# Patient Record
Sex: Male | Born: 1960
Health system: Southern US, Community
[De-identification: ages and names within clinical notes are randomized; demographics above are authoritative.]

## PROBLEM LIST (undated history)

## (undated) DIAGNOSIS — K635 Polyp of colon: Principal | ICD-10-CM

## (undated) DIAGNOSIS — Z86718 Personal history of other venous thrombosis and embolism: Secondary | ICD-10-CM

## (undated) DIAGNOSIS — Z9109 Other allergy status, other than to drugs and biological substances: Secondary | ICD-10-CM

## (undated) HISTORY — DX: Polyp of colon: K63.5

## (undated) HISTORY — PX: SHOULDER ARTHROSCOPY: SHX128

## (undated) HISTORY — DX: Personal history of other venous thrombosis and embolism: Z86.718

## (undated) HISTORY — PX: SHOULDER ARTHROSCOPY WITH SUBACROMIAL DECOMPRESSION, ROTATOR CUFF REPAIR AND BICEP TENDON REPAIR: SHX5687

## (undated) HISTORY — DX: Other allergy status, other than to drugs and biological substances: Z91.09

---

## 1998-12-01 HISTORY — PX: GANGLION CYST EXCISION: SHX1691

## 2001-03-05 ENCOUNTER — Encounter (INDEPENDENT_AMBULATORY_CARE_PROVIDER_SITE_OTHER): Payer: Self-pay

## 2001-03-05 ENCOUNTER — Ambulatory Visit (HOSPITAL_COMMUNITY): Admission: RE | Admit: 2001-03-05 | Discharge: 2001-03-05 | Payer: Self-pay | Admitting: Orthopedic Surgery

## 2002-01-21 ENCOUNTER — Encounter (INDEPENDENT_AMBULATORY_CARE_PROVIDER_SITE_OTHER): Payer: Self-pay | Admitting: Specialist

## 2002-01-21 ENCOUNTER — Ambulatory Visit (HOSPITAL_COMMUNITY): Admission: RE | Admit: 2002-01-21 | Discharge: 2002-01-21 | Payer: Self-pay | Admitting: Orthopedic Surgery

## 2002-12-04 ENCOUNTER — Emergency Department (HOSPITAL_COMMUNITY): Admission: EM | Admit: 2002-12-04 | Discharge: 2002-12-05 | Payer: Self-pay | Admitting: Emergency Medicine

## 2002-12-04 ENCOUNTER — Encounter: Payer: Self-pay | Admitting: Emergency Medicine

## 2003-03-20 ENCOUNTER — Emergency Department (HOSPITAL_COMMUNITY): Admission: EM | Admit: 2003-03-20 | Discharge: 2003-03-21 | Payer: Self-pay

## 2007-11-11 ENCOUNTER — Encounter: Admission: RE | Admit: 2007-11-11 | Discharge: 2007-11-11 | Payer: Self-pay | Admitting: Internal Medicine

## 2011-04-18 NOTE — Op Note (Signed)
[facility]  Patient:    Justin Wong, Justin Wong                    MRN: [id number] Proc. Date: 03/05/01 Adm. Date:  [id number] Attending:  Marlowe Kays Page                           Operative Report  PREOPERATIVE DIAGNOSES: 1. Ganglion cyst flexure left wrist. 2. Suspected lipoma posterior right shoulder. 3. Painful subcutaneous nodules left lower back and abdomen (total #5).  POSTOPERATIVE DIAGNOSES: 1. Ganglion left wrist arising from carpal metacarpal joint. 2. Large lipoma posterior right shoulder. 3. Subcutaneous lipomas left lower back and abdomen and left chest.  OPERATION: 1. Incision of ganglion cyst flexure of left wrist. 2. Incision of the lipoma posterior right shoulder. 3. Excision of multiple subcutaneous lipomas left lower back, abdomen, and    left chest wall.  SURGEON:  Illene Labrador. Aplington, M.D.  ASSISTANT:  Nurse.  ANESTHESIA:  General.  PATHOLOGY AND JUSTIFICATION FOR PROCEDURE:  The lipomas were all painful for Justin Wong.  The cyst was also painful.  See operative description below.  DESCRIPTION OF PROCEDURE:  Satisfactory general anesthesia.  We first started out in the prone position and, preoperatively, I had marked out the areas of the painful subcutaneous masses.  Starting first on the back, I prepped the back and the shoulder with Betadine and draped in a sterile field.  Small Iobans employed.  In both cases, I infiltrated the proposed line of incision with 0.5% Marcaine with adrenalin.  In the left lower back, going through the subcutaneous tissue, I found a 1 cm very firm lipomatous mass which was different from his other fatty tissue and corresponded to the area of pain and tenderness, and this was excised.  The subcutaneous tissue was closed with 3-0 Vicryl and skin with interrupted 4-0 nylon mattress sutures.  On his right shoulder, he had a 5 x 7 cm large lipoma which also appeared benign.  One feeder vessel into it  was coagulated.  The wound was also infiltrated with 0.5% Marcaine with adrenalin.  The subcutaneous tissue with a cavity present with reapproximated with interrupted 2-0 Vicryl and the skin with interrupted 3-0 nylon mattress sutures.  In this wound, Betadine, Adaptic dry sterile dressing and on the low back wound, two Band-Aids were applied.  We then turned Justin Wong over onto another OR table and placed Justin Wong in the supine position.  I first prepped out the abdomen, as we had the back, and draped the areas in a sterile field.  The left thoracic nodule was handled like the structures posteriorly, and I found a 1.5 cm firm, lipomatous-type mass.  The subcutaneous tissue was closed with interrupted 3-0 Vicryl and skin with interrupted 4-0 nylon mattress sutures.  The left lower abdominal mass was larger at 3 cm x 1.5 cm and was handled as was the wound just above it.  The two wounds in the right lower abdomen had one lipoma which measured 5 cm x 2 cm and the other one, which was much smaller.  These were handles as were the other wounds in terms of closure.  I then placed Betadine, Adaptic dry sterile dressing on these wound except for the left upper one which we closed skin with Band-aids.  I then prepared the left upper extremity for the surgery.  Pneumatic tourniquet was applied and the left forearm just distal to the fingers  was prepped with Duraprep and draped in a sterile field.  I made a vertical incision along the mass which was about 1 cm in size, through the subcutaneous tissue, sensory nerve was identified and protected.  He had multiple vascular structures which I also tried to protect.  The cyst was dissected out and was carried down to what appeared to be carpal metacarpal joint.  It was excised in its base and the defect closed with multiple interrupted 4-0 Vicryl.  Several bleeders were coagulated with bipolar cautery.  I then released the tourniquet, and there was no unusual  bleeding at this point.  This allowed me to close the skin and subcutaneous tissue only with interrupted 4-0 nylon mattress sutures.  This wound was also infiltrated with 0.5% Marcaine with adrenalin.  Betadine, Adaptic dry sterile dressing, and a thumb spike and splint cast was applied.  He tolerated the procedure well and at the time of this dictation, was on his way to the recovery room in satisfactory condition with no known complications. DD:  03/05/01 TD:  03/05/01 Job: [id number]

## 2011-04-18 NOTE — Op Note (Signed)
[facility]  Patient:    Justin Wong, Justin Wong Visit Number: [id number] MRN: [id number]          Service Type: Attending:  Illene Labrador. Aplington, M.D. Dictated by:   Illene Labrador. Aplington, M.D. Proc. Date: 01/21/02                             Operative Report  PREOPERATIVE DIAGNOSIS:  Recurrent ganglion cyst in the flexor radial surface, left wrist.  POSTOPERATIVE DIAGNOSIS:  Mass flexor radial surface, left wrist.  OPERATION:  Excision of mass, flexor radial surface, left wrist.  SURGEON:  Illene Labrador. Aplington, M.D.  ASSISTANT:  Nurse.  ANESTHESIA:  General.  PATHOLOGY AND JUSTIFICATION FOR PROCEDURE:  Among may operative procedures, I had excised a ganglion cyst on this flexor left wrist on March 05, 2001.  He subsequently had a recurrent mass there which has fluctuated in size and is here at this time for excision.  See operative description below for additional details.  DESCRIPTION OF PROCEDURE:  Satisfactory general anesthesia, pneumatic tourniquet, DuraPrep from mid forearm to fingertips, draped in a sterile field.  I went through the old surgical incision basically over the flexor radial wrist and curving slightly towards the midline at the base of the thenar eminence.  A swollen, nodular mass was identified.  I was able to feel it preoperatively.  Intimately entwined with it was a very vascular plexus which I dissected off as carefully as possible.  What appeared to be the radial artery was intimately involved with this mass.  I dissected off the artery and followed the mass which turned out to be very firm, almost neuromatous in nature but also did have a stalk which went down to the carpometacarpal joint.  I followed down to the carpometacarpal joint where I sectioned it at this point.  The mass was sent to pathology.  I then closed the aperture, going down the carpometacarpal joint tightly with multiple interrupted 3-0 Vicryl sutures.  I then  released the tourniquet, and there was a significant amount of bleeding.  I had used a bipolar cautery for what appeared to be potential bleeders during the case.  Some of the smaller bleeding was controlled, but I had to reinflate the tourniquet and found that the major bleeding was from a deformed section of radial artery where it appeared that multiple tributaries were coming off, and this was just not repairable.  Consequently, I clamped the artery on either side of this major interchange, removing it and then tying the artery on both ends.  I then re-released the tourniquet.  There was still some small bleeding more towards the radial side, and I was able to control this with regular and bipolar cautery.  The wound was then dry at the time of closure.  I placed Gelfoam over the repair site and over the areas of the previous residual bleeding which I had cauterized.  The skin and subcutaneous tissue, I closed as a unit with interrupted 4-0 nylon mattress sutures.  Betadine, Adaptic dry sterile dressing and the thumb spike as splint cast were applied.  He tolerated the procedure well and at the time of this dictation was on his way to the recovery room in satisfactory condition with no known complications. Dictated by:   Illene Labrador. Aplington, M.D. Attending:  Illene Labrador. Aplington, M.D. DD:  01/21/02 TD:  01/21/02 Job: [id number]

## 2011-04-18 NOTE — Op Note (Signed)
NAME:  Justin Wong, Justin Wong                       ACCOUNT NO.:  [id number]   MEDICAL RECORD NO.:  [id number]                   PATIENT TYPE:  EMS   LOCATION:  ED                                   FACILITY:  [facility]   PHYSICIAN:  Lyndal Pulley. Chales Salmon, M.D.                DATE OF BIRTH:  June 20, 1961   DATE OF PROCEDURE:  03/21/2003  DATE OF DISCHARGE:  03/21/2003                                 OPERATIVE REPORT   PREOPERATIVE DIAGNOSES:  1. Multiple left eyelid lacerations.  2. Left stellate full thickness complicated brow laceration.  3. Vertical left upper eyelid through and through laceration.  4. Left horizontal lower eyelid full thickness laceration.   POSTOPERATIVE DIAGNOSES:  1. Multiple left eyelid lacerations.  2. Left stellate full thickness complicated brow laceration.  3. Vertical left upper eyelid through and through laceration.  4. Left horizontal lower eyelid full thickness laceration.   OPERATION PERFORMED:  Closure of above laceration.   SURGEON:  Lyndal Pulley. Chales Salmon, M.D.   ANESTHESIA:  Local.   INDICATIONS FOR PROCEDURE:  The patient is a 50 year old male who presented  to the emergency department at [facility], while cutting wood was struck in  the left eye area. He was evaluated by the emergency room doctor who did  feel he had a left corneal abrasion and I was consulted for closure of the  lacerations. He had no other injuries reported.   DESCRIPTION OF PROCEDURE:  The patient was identified in the emergency  department and was given approximately 6 mL of 2% lidocaine with 1:100,000  epinephrine around the lacerations. The wounds were then irrigated with  copious amounts of sterile saline and gently debrided. Additionally,  Betadine solution was also used to irrigate the wound and was irrigated from  the wounds as well. Attention was first directed to the left eyelid  laceration which measured approximately 4 cm in length. The laceration  extended tangentially through  the skin, underlying auricularis muscle  down  to the underlying orbital septum. The wound was carefully closed in layers  first using 5-0 plain gut suture reapproximating the orbicularis muscle.  Next, the skin was closed with 6-0 plain gut suture in a running baseball  fashion. Attention was then directed to the left eyebrow laceration which  was found to be a stellate pattern which extended down through the  underlying skin, subcutaneous tissue, muscle, and down to the underlying  periosteum over the left supraorbital rim. The wound was also carefully  closed in layers, first reapproximating the deep tissue with 4-0 plain gut  suture using several interrupted sutures. Next, several 5-0 plain gut  sutures were placed as subcutaneous stitches. The skin was then closed with  5-0 nylon suture both in interrupted and baseball fashion. Next, attention  was directed to the left upper eyelid vertical incision which was found to  be a through and through laceration extending through the  tarsal plate and  lash line. This extended through the conjunctive on the inside of the upper  eyelid as well. The laceration measured approximately 3 cm. Initially the  edge of the skin over the tarsal plate was reapproximated and closed with a  6-0 nylon suture. Next, several deep interrupted sutures were placed using 5-  0 plain gut suture. The skin was closed again with 6-0 nylon suture. All of  the wounds were coated with Neosporin ointment and postoperative  instructions were given to him. He also was given Keflex 500 mg x 30 to be  taken 4 times a day until gone. He was also given Maxidone 1 p.o. q. 4-6h  p.r.n. pain. He also was instructed to followup with an ophthalmologist in  the morning for the corneal abrasion and further ophthalmologic examination.  He also should be followed in my office in one week for suture removal and  followup.                                                Lyndal Pulley Chales Salmon,  M.D.    TGO/MEDQ  D:  03/21/2003  T:  03/21/2003  Job:  [id number]

## 2011-06-24 ENCOUNTER — Ambulatory Visit: Payer: Self-pay | Admitting: Family Medicine

## 2011-10-30 ENCOUNTER — Telehealth: Payer: Self-pay | Admitting: Internal Medicine

## 2011-10-30 NOTE — Telephone Encounter (Signed)
This pt's friend Harriett Sine [phone]) has called and asked if Dr.[name] will take him on as a new pt.  The pt has [employer].  Please advise   Thanks so much!!

## 2011-11-03 NOTE — Telephone Encounter (Signed)
OK - pls inform of long waits we have Thx

## 2012-02-17 ENCOUNTER — Other Ambulatory Visit (INDEPENDENT_AMBULATORY_CARE_PROVIDER_SITE_OTHER): Payer: BC Managed Care – PPO

## 2012-02-17 ENCOUNTER — Ambulatory Visit (INDEPENDENT_AMBULATORY_CARE_PROVIDER_SITE_OTHER): Payer: BC Managed Care – PPO | Admitting: Internal Medicine

## 2012-02-17 ENCOUNTER — Encounter: Payer: Self-pay | Admitting: Internal Medicine

## 2012-02-17 VITALS — BP 140/100 | HR 80 | Temp 98.6°F | Resp 16 | Ht 60.5 in | Wt 184.0 lb

## 2012-02-17 DIAGNOSIS — Z0000 Encounter for general adult medical examination without abnormal findings: Secondary | ICD-10-CM

## 2012-02-17 DIAGNOSIS — Z1211 Encounter for screening for malignant neoplasm of colon: Secondary | ICD-10-CM

## 2012-02-17 LAB — CBC WITH DIFFERENTIAL/PLATELET
Basophils Absolute: 0 10*3/uL (ref 0.0–0.1)
Eosinophils Relative: 5.3 % — ABNORMAL HIGH (ref 0.0–5.0)
HCT: 44.9 % (ref 39.0–52.0)
Hemoglobin: 15.2 g/dL (ref 13.0–17.0)
Lymphocytes Relative: 22.2 % (ref 12.0–46.0)
Lymphs Abs: 1.5 10*3/uL (ref 0.7–4.0)
Monocytes Relative: 8.8 % (ref 3.0–12.0)
Neutro Abs: 4.2 10*3/uL (ref 1.4–7.7)
Platelets: 313 10*3/uL (ref 150.0–400.0)
RDW: 13.5 % (ref 11.5–14.6)
WBC: 6.7 10*3/uL (ref 4.5–10.5)

## 2012-02-17 LAB — LIPID PANEL
Cholesterol: 205 mg/dL — ABNORMAL HIGH (ref 0–200)
HDL: 45.6 mg/dL (ref >39.00)
Total CHOL/HDL Ratio: 4
VLDL: 11.6 mg/dL (ref 0.0–40.0)

## 2012-02-17 LAB — COMPREHENSIVE METABOLIC PANEL
AST: 24 U/L (ref 0–37)
Alkaline Phosphatase: 64 U/L (ref 39–117)
BUN: 17 mg/dL (ref 6–23)
Glucose, Bld: 96 mg/dL (ref 70–99)
Total Bilirubin: 0.7 mg/dL (ref 0.3–1.2)

## 2012-02-17 LAB — TSH: TSH: 1.76 u[IU]/mL (ref 0.35–5.50)

## 2012-02-17 LAB — URINALYSIS
Bilirubin Urine: NEGATIVE
Leukocytes, UA: NEGATIVE
Nitrite: NEGATIVE
Total Protein, Urine: NEGATIVE
pH: 7.5 (ref 5.0–8.0)

## 2012-02-17 LAB — LDL CHOLESTEROL, DIRECT: Direct LDL: 150.1 mg/dL

## 2012-02-17 NOTE — Progress Notes (Signed)
  Subjective:    Patient ID: Justin Wong, male    DOB: 01/27/1961, 51 y.o.   MRN: [id number]  HPI  The patient is here for a wellness exam. The patient has been doing well overall without major physical or psychological issues going on lately.  Review of Systems  Constitutional: Negative for appetite change, fatigue and unexpected weight change.  HENT: Negative for nosebleeds, congestion, sore throat, sneezing, trouble swallowing and neck pain.   Eyes: Negative for itching and visual disturbance.  Respiratory: Negative for cough.   Cardiovascular: Negative for chest pain, palpitations and leg swelling.  Gastrointestinal: Negative for nausea, diarrhea, blood in stool and abdominal distention.  Genitourinary: Negative for frequency and hematuria.  Musculoskeletal: Negative for back pain, joint swelling and gait problem.  Skin: Negative for rash.  Neurological: Negative for dizziness, tremors, speech difficulty and weakness.  Psychiatric/Behavioral: Negative for suicidal ideas, sleep disturbance, dysphoric mood and agitation. The patient is not nervous/anxious.        Objective:   Physical Exam  Constitutional: He is oriented to person, place, and time. He appears well-developed and well-nourished. No distress.  HENT:  Head: Normocephalic and atraumatic.  Right Ear: External ear normal.  Left Ear: External ear normal.  Nose: Nose normal.  Mouth/Throat: Oropharynx is clear and moist. No oropharyngeal exudate.  Eyes: Conjunctivae and EOM are normal. Pupils are equal, round, and reactive to light. Right eye exhibits no discharge. Left eye exhibits no discharge. No scleral icterus.  Neck: Normal range of motion. Neck supple. No JVD present. No tracheal deviation present. No thyromegaly present.  Cardiovascular: Normal rate, regular rhythm, normal heart sounds and intact distal pulses.  Exam reveals no gallop and no friction rub.   No murmur heard. Pulmonary/Chest: Effort normal and  breath sounds normal. No stridor. No respiratory distress. He has no wheezes. He has no rales. He exhibits no tenderness.  Abdominal: Soft. Bowel sounds are normal. He exhibits no distension and no mass. There is no tenderness. There is no rebound and no guarding.  Genitourinary: Rectum normal, prostate normal and penis normal. Guaiac negative stool. No penile tenderness.  Musculoskeletal: Normal range of motion. He exhibits no edema and no tenderness.  Lymphadenopathy:    He has no cervical adenopathy.  Neurological: He is alert and oriented to person, place, and time. He has normal reflexes. No cranial nerve deficit. He exhibits normal muscle tone. Coordination normal.  Skin: Skin is warm and dry. No rash noted. He is not diaphoretic. No erythema. No pallor.  Psychiatric: He has a normal mood and affect. His behavior is normal. Judgment and thought content normal.     EKG w/PAC     Assessment & Plan:

## 2012-02-17 NOTE — Assessment & Plan Note (Signed)
We discussed age appropriate health related issues, including available/recomended screening tests and vaccinations. We discussed a need for adhering to healthy diet and exercise. Labs/EKG were reviewed/ordered. All questions were answered. Colon ordered DT up-to-date

## 2012-03-26 ENCOUNTER — Ambulatory Visit (AMBULATORY_SURGERY_CENTER): Payer: BC Managed Care – PPO | Admitting: *Deleted

## 2012-03-26 VITALS — Ht 65.0 in | Wt 186.6 lb

## 2012-03-26 DIAGNOSIS — Z1211 Encounter for screening for malignant neoplasm of colon: Secondary | ICD-10-CM

## 2012-03-26 MED ORDER — PEG-KCL-NACL-NASULF-NA ASC-C 100 G PO SOLR: ORAL | Status: DC

## 2012-04-09 ENCOUNTER — Encounter: Payer: Self-pay | Admitting: Gastroenterology

## 2012-04-09 ENCOUNTER — Ambulatory Visit (AMBULATORY_SURGERY_CENTER): Payer: BC Managed Care – PPO | Admitting: Gastroenterology

## 2012-04-09 VITALS — BP 120/74 | HR 69 | Temp 96.1°F | Resp 15 | Ht 65.0 in | Wt 186.0 lb

## 2012-04-09 DIAGNOSIS — D126 Benign neoplasm of colon, unspecified: Secondary | ICD-10-CM

## 2012-04-09 DIAGNOSIS — Z1211 Encounter for screening for malignant neoplasm of colon: Secondary | ICD-10-CM

## 2012-04-09 MED ORDER — SODIUM CHLORIDE 0.9 % IV SOLN: 500.0000 mL | INTRAVENOUS | Status: DC

## 2012-04-09 NOTE — Patient Instructions (Signed)

## 2012-04-09 NOTE — Progress Notes (Signed)
Patient did not experience any of the following events: a burn prior to discharge; a fall within the facility; wrong site/side/patient/procedure/implant event; or a hospital transfer or hospital admission upon discharge from the facility. (G8907) Patient did not have preoperative order for IV antibiotic SSI prophylaxis. (G8918)  

## 2012-04-09 NOTE — Op Note (Signed)
[facility] [address] [place], [place]  [address]  COLONOSCOPY PROCEDURE REPORT PATIENT:  Justin Wong, Justin Wong  MR#:  [id number] BIRTHDATE:  [birth year], 50 yrs. old  GENDER:  male ENDOSCOPIST:  Judie Petit T. Russella Dar, MD, [facility] Referred by:  Linda Hedges. [name], M.D. PROCEDURE DATE:  04/09/2012 PROCEDURE:  Colonoscopy with biopsy and snare polypectomy ASA CLASS:  Class II INDICATIONS:  1) Routine Risk Screening MEDICATIONS:   These medications were titrated to patient response per physician's verbal order, Fentanyl 75 mcg IV, Versed 8 mg IV DESCRIPTION OF PROCEDURE:   After the risks benefits and alternatives of the procedure were thoroughly explained, informed consent was obtained.  Digital rectal exam was performed and revealed no abnormalities.   The LB CF-[id number] endoscope was introduced through the anus and advanced to the cecum, which was identified by both the appendix and ileocecal valve, without limitations.  The quality of the prep was good, using MoviPrep. The instrument was then slowly withdrawn as the colon was fully examined. <<PROCEDUREIMAGES>> FINDINGS:  A sessile polyp was found in the ascending colon. It was 4 mm in size. The polyp was removed using cold biopsy forceps. A sessile polyp was found in the distal transverse colon. It was 10 mm in size. Polyp was snared, then cauterized with monopolar cautery. Retrieval was successful. Otherwise normal colonoscopy without other polyps, masses, vascular ectasias, or inflammatory changes. Retroflexed views in the rectum revealed no abnormalities.   The time to cecum =  1.33  minutes. The scope was then withdrawn (time =  11.25  min) from the patient and the procedure completed.  COMPLICATIONS:  None  ENDOSCOPIC IMPRESSION: 1) 4 mm sessile polyp in the ascending colon 2) 10 mm sessile polyp in the distal transverse colon  RECOMMENDATIONS: 1) Hold aspirin, aspirin products, and anti-inflammatory medication  for 2 weeks. 2) Await pathology results 3) Repeat Colonoscopy in 3 years if the larger polyp is adenomatous, 5 years if only the smaller polyp is adenomatous, otherwise 10 years.  Venita Lick. Russella Dar, MD, [facility]  n. eSIGNED:   Venita Lick. Iaan Oregel at 04/09/2012 11:30 AM  Signa Kell, [phone]

## 2012-04-12 ENCOUNTER — Telehealth: Payer: Self-pay | Admitting: *Deleted

## 2012-04-12 NOTE — Telephone Encounter (Signed)
  Follow up Call-  Call back number 04/09/2012  Post procedure Call Back phone  # [phone] cell  Permission to leave phone message Yes     Patient questions:  Do you have a fever, pain , or abdominal swelling? no Pain Score  0 *  Have you tolerated food without any problems? yes  Have you been able to return to your normal activities? yes  Do you have any questions about your discharge instructions: Diet   no Medications  no Follow up visit  no  Do you have questions or concerns about your Care? no  Actions: * If pain score is 4 or above: No action needed, pain <4.

## 2012-04-13 ENCOUNTER — Encounter: Payer: Self-pay | Admitting: Gastroenterology

## 2012-04-16 NOTE — Progress Notes (Signed)
Addended by: Maple Hudson on: 04/16/2012 01:34 PM   Modules accepted: Level of Service

## 2012-05-25 ENCOUNTER — Ambulatory Visit (INDEPENDENT_AMBULATORY_CARE_PROVIDER_SITE_OTHER)
Admission: RE | Admit: 2012-05-25 | Discharge: 2012-05-25 | Disposition: A | Discharge status: Home / Self Care | Payer: BC Managed Care – PPO | Source: Ambulatory Visit | Attending: Internal Medicine | Admitting: Internal Medicine

## 2012-05-25 ENCOUNTER — Encounter: Payer: Self-pay | Admitting: Internal Medicine

## 2012-05-25 ENCOUNTER — Ambulatory Visit (INDEPENDENT_AMBULATORY_CARE_PROVIDER_SITE_OTHER): Payer: BC Managed Care – PPO | Admitting: Internal Medicine

## 2012-05-25 VITALS — BP 134/90 | HR 72 | Temp 98.2°F | Resp 16 | Wt 188.0 lb

## 2012-05-25 DIAGNOSIS — R05 Cough: Secondary | ICD-10-CM | POA: Insufficient documentation

## 2012-05-25 DIAGNOSIS — J209 Acute bronchitis, unspecified: Secondary | ICD-10-CM | POA: Insufficient documentation

## 2012-05-25 DIAGNOSIS — R059 Cough, unspecified: Secondary | ICD-10-CM

## 2012-05-25 DIAGNOSIS — J45901 Unspecified asthma with (acute) exacerbation: Secondary | ICD-10-CM

## 2012-05-25 MED ORDER — AZITHROMYCIN 500 MG PO TABS: 500.0000 mg | ORAL_TABLET | Freq: Every day | ORAL | Status: AC

## 2012-05-25 MED ORDER — FLUTICASONE-SALMETEROL 100-50 MCG/DOSE IN AEPB: 1.0000 | INHALATION_SPRAY | Freq: Two times a day (BID) | RESPIRATORY_TRACT | Status: DC

## 2012-05-25 MED ORDER — METHYLPREDNISOLONE 4 MG PO KIT: PACK | ORAL | Status: AC

## 2012-05-25 MED ORDER — HYDROCOD POLST-CPM POLST ER 10-8 MG PO CP12: 1.0000 | ORAL_CAPSULE | Freq: Two times a day (BID) | ORAL | Status: DC | PRN

## 2012-05-25 NOTE — Patient Instructions (Signed)

## 2012-05-25 NOTE — Progress Notes (Signed)
Subjective:    Patient ID: Justin Wong, male    DOB: August 22, 1961, 51 y.o.   MRN: [id number]  Cough This is a new problem. The current episode started 1 to 4 weeks ago. The problem has been gradually worsening. The problem occurs every few hours. The cough is productive of purulent sputum. Associated symptoms include shortness of breath and wheezing. Pertinent negatives include no chest pain, chills, ear congestion, fever, heartburn, myalgias, nasal congestion, postnasal drip, rash, sweats or weight loss. Nothing aggravates the symptoms. He has tried a beta-agonist inhaler for the symptoms. The treatment provided mild relief.      Review of Systems  Constitutional: Negative for fever, chills, weight loss, diaphoresis, activity change, appetite change, fatigue and unexpected weight change.  HENT: Negative.  Negative for postnasal drip.   Eyes: Negative.   Respiratory: Positive for cough, shortness of breath and wheezing. Negative for apnea, chest tightness and stridor.   Cardiovascular: Negative for chest pain, palpitations and leg swelling.  Gastrointestinal: Negative.  Negative for heartburn.  Genitourinary: Negative.   Musculoskeletal: Negative for myalgias, back pain, joint swelling, arthralgias and gait problem.  Skin: Negative for color change, pallor, rash and wound.  Neurological: Negative.   Hematological: Negative for adenopathy. Does not bruise/bleed easily.       Objective:   Physical Exam  Vitals reviewed. Constitutional: He is oriented to person, place, and time. He appears well-developed and well-nourished.  Non-toxic appearance. He does not have a sickly appearance. He does not appear ill. No distress.  HENT:  Head: Normocephalic and atraumatic.  Mouth/Throat: Oropharynx is clear and moist. No oropharyngeal exudate.  Eyes: Conjunctivae are normal. Right eye exhibits no discharge. Left eye exhibits no discharge. No scleral icterus.  Neck: Normal range of motion.  Neck supple. No JVD present. No tracheal deviation present. No thyromegaly present.  Cardiovascular: Normal rate, regular rhythm, normal heart sounds and intact distal pulses.  Exam reveals no gallop and no friction rub.   No murmur heard. Pulmonary/Chest: Effort normal. No accessory muscle usage or stridor. Not tachypneic. No respiratory distress. He has no decreased breath sounds. He has wheezes in the right middle field and the left middle field. He has rhonchi in the right middle field and the left middle field. He has no rales. He exhibits no tenderness.  Abdominal: Soft. Bowel sounds are normal. He exhibits no distension and no mass. There is no tenderness. There is no rebound and no guarding.  Musculoskeletal: Normal range of motion. He exhibits no edema and no tenderness.  Lymphadenopathy:    He has no cervical adenopathy.  Neurological: He is oriented to person, place, and time.  Skin: Skin is warm and dry. No rash noted. He is not diaphoretic. No erythema. No pallor.  Psychiatric: He has a normal mood and affect. His behavior is normal. Judgment and thought content normal.      Lab Results  Component Value Date   WBC 6.7 02/17/2012   HGB 15.2 02/17/2012   HCT 44.9 02/17/2012   PLT 313.0 02/17/2012   GLUCOSE 96 02/17/2012   CHOL 205* 02/17/2012   TRIG 58.0 02/17/2012   HDL 45.60 02/17/2012   LDLDIRECT 150.1 02/17/2012   ALT 24 02/17/2012   AST 24 02/17/2012   NA 140 02/17/2012   K 5.2* 02/17/2012   CL 105 02/17/2012   CREATININE 0.9 02/17/2012   BUN 17 02/17/2012   CO2 28 02/17/2012   TSH 1.76 02/17/2012   PSA 1.31 02/17/2012  Assessment & Plan:

## 2012-05-25 NOTE — Assessment & Plan Note (Signed)
Start zpak for the infection and a cough suppressant 

## 2012-05-25 NOTE — Assessment & Plan Note (Addendum)
He will treat this with medrol dose pak and started on advair diskus

## 2012-05-25 NOTE — Assessment & Plan Note (Signed)
I will check a CXR to see if there is PNA, edema, mass

## 2012-12-14 ENCOUNTER — Telehealth: Payer: Self-pay | Admitting: *Deleted

## 2012-12-14 DIAGNOSIS — Z0389 Encounter for observation for other suspected diseases and conditions ruled out: Secondary | ICD-10-CM

## 2012-12-14 DIAGNOSIS — Z0000 Encounter for general adult medical examination without abnormal findings: Secondary | ICD-10-CM

## 2012-12-14 NOTE — Telephone Encounter (Signed)
Labs entered.

## 2012-12-14 NOTE — Telephone Encounter (Signed)
Message copied by Merrilyn Puma on Tue Dec 14, 2012 11:53 AM ------      Message from: Etheleen Sia      Created: Mon Nov 29, 2012 12:08 PM      Regarding: LABS       PHYSICAL LABS FOR MARCH

## 2013-02-18 ENCOUNTER — Encounter: Payer: BC Managed Care – PPO | Admitting: Internal Medicine

## 2013-02-21 ENCOUNTER — Other Ambulatory Visit (INDEPENDENT_AMBULATORY_CARE_PROVIDER_SITE_OTHER): Payer: BC Managed Care – PPO

## 2013-02-21 ENCOUNTER — Encounter: Payer: Self-pay | Admitting: Internal Medicine

## 2013-02-21 ENCOUNTER — Ambulatory Visit (INDEPENDENT_AMBULATORY_CARE_PROVIDER_SITE_OTHER): Payer: BC Managed Care – PPO | Admitting: Internal Medicine

## 2013-02-21 VITALS — BP 150/100 | HR 80 | Temp 98.1°F | Resp 16 | Ht 65.0 in | Wt 186.0 lb

## 2013-02-21 DIAGNOSIS — Z0000 Encounter for general adult medical examination without abnormal findings: Secondary | ICD-10-CM

## 2013-02-21 DIAGNOSIS — K635 Polyp of colon: Secondary | ICD-10-CM | POA: Insufficient documentation

## 2013-02-21 DIAGNOSIS — Z23 Encounter for immunization: Secondary | ICD-10-CM

## 2013-02-21 DIAGNOSIS — Z0389 Encounter for observation for other suspected diseases and conditions ruled out: Secondary | ICD-10-CM

## 2013-02-21 DIAGNOSIS — D126 Benign neoplasm of colon, unspecified: Secondary | ICD-10-CM

## 2013-02-21 DIAGNOSIS — J45901 Unspecified asthma with (acute) exacerbation: Secondary | ICD-10-CM

## 2013-02-21 HISTORY — DX: Polyp of colon: K63.5

## 2013-02-21 LAB — URINALYSIS, ROUTINE W REFLEX MICROSCOPIC
Ketones, ur: NEGATIVE
Specific Gravity, Urine: 1.02 (ref 1.000–1.030)
Total Protein, Urine: NEGATIVE
Urine Glucose: NEGATIVE
Urobilinogen, UA: 0.2 (ref 0.0–1.0)
pH: 6 (ref 5.0–8.0)

## 2013-02-21 LAB — CBC WITH DIFFERENTIAL/PLATELET
Eosinophils Relative: 5.6 % — ABNORMAL HIGH (ref 0.0–5.0)
HCT: 42.8 % (ref 39.0–52.0)
Lymphs Abs: 1.4 10*3/uL (ref 0.7–4.0)
MCV: 92.6 fl (ref 78.0–100.0)
Monocytes Absolute: 0.6 10*3/uL (ref 0.1–1.0)
Platelets: 260 10*3/uL (ref 150.0–400.0)
RDW: 12.8 % (ref 11.5–14.6)
WBC: 6.4 10*3/uL (ref 4.5–10.5)

## 2013-02-21 LAB — HEPATIC FUNCTION PANEL
Alkaline Phosphatase: 72 U/L (ref 39–117)
Bilirubin, Direct: 0.1 mg/dL (ref 0.0–0.3)
Total Bilirubin: 0.7 mg/dL (ref 0.3–1.2)
Total Protein: 7 g/dL (ref 6.0–8.3)

## 2013-02-21 LAB — LIPID PANEL
HDL: 41.9 mg/dL (ref >39.00)
LDL Cholesterol: 137 mg/dL — ABNORMAL HIGH (ref 0–99)
Total CHOL/HDL Ratio: 5
Triglycerides: 75 mg/dL (ref 0.0–149.0)

## 2013-02-21 LAB — BASIC METABOLIC PANEL
CO2: 27 mEq/L (ref 19–32)
Calcium: 9.4 mg/dL (ref 8.4–10.5)
Creatinine, Ser: 1.1 mg/dL (ref 0.4–1.5)
GFR: 78.13 mL/min (ref >60.00)
Sodium: 135 mEq/L (ref 135–145)

## 2013-02-21 LAB — PSA: PSA: 0.92 ng/mL (ref 0.10–4.00)

## 2013-02-21 MED ORDER — FLUTICASONE-SALMETEROL 100-50 MCG/DOSE IN AEPB: 1.0000 | INHALATION_SPRAY | Freq: Two times a day (BID) | RESPIRATORY_TRACT | Status: DC

## 2013-02-21 MED ORDER — ALBUTEROL SULFATE HFA 108 (90 BASE) MCG/ACT IN AERS: 2.0000 | INHALATION_SPRAY | Freq: Four times a day (QID) | RESPIRATORY_TRACT | Status: DC | PRN

## 2013-02-21 NOTE — Patient Instructions (Signed)
Call me if the BP is high

## 2013-02-21 NOTE — Assessment & Plan Note (Signed)
2013 Dr Russella Dar Due colon 2016

## 2013-02-21 NOTE — Progress Notes (Signed)
  Subjective:  HPI  The patient is here for a wellness exam. The patient has been doing well overall without major physical or psychological issues going on lately. BP is nl at home.  Review of Systems  Constitutional: Negative for appetite change, fatigue and unexpected weight change.  HENT: Negative for nosebleeds, congestion, sore throat, sneezing, trouble swallowing and neck pain.   Eyes: Negative for itching and visual disturbance.  Respiratory: Negative for cough.   Cardiovascular: Negative for chest pain, palpitations and leg swelling.  Gastrointestinal: Negative for nausea, diarrhea, blood in stool and abdominal distention.  Genitourinary: Negative for frequency and hematuria.  Musculoskeletal: Negative for back pain, joint swelling and gait problem.  Skin: Negative for rash.  Neurological: Negative for dizziness, tremors, speech difficulty and weakness.  Psychiatric/Behavioral: Negative for suicidal ideas, sleep disturbance, dysphoric mood and agitation. The patient is not nervous/anxious.        Objective:   Physical Exam  Constitutional: He is oriented to person, place, and time. He appears well-developed and well-nourished. No distress.  HENT:  Head: Normocephalic and atraumatic.  Right Ear: External ear normal.  Left Ear: External ear normal.  Nose: Nose normal.  Mouth/Throat: Oropharynx is clear and moist. No oropharyngeal exudate.  Eyes: Conjunctivae and EOM are normal. Pupils are equal, round, and reactive to light. Right eye exhibits no discharge. Left eye exhibits no discharge. No scleral icterus.  Neck: Normal range of motion. Neck supple. No JVD present. No tracheal deviation present. No thyromegaly present.  Cardiovascular: Normal rate, regular rhythm, normal heart sounds and intact distal pulses.  Exam reveals no gallop and no friction rub.   No murmur heard. Pulmonary/Chest: Effort normal and breath sounds normal. No stridor. No respiratory distress. He has no  wheezes. He has no rales. He exhibits no tenderness.  Abdominal: Soft. Bowel sounds are normal. He exhibits no distension and no mass. There is no tenderness. There is no rebound and no guarding.  Genitourinary: Rectum normal, prostate normal and penis normal. Guaiac negative stool. No penile tenderness.  Musculoskeletal: Normal range of motion. He exhibits no edema and no tenderness.  Lymphadenopathy:    He has no cervical adenopathy.  Neurological: He is alert and oriented to person, place, and time. He has normal reflexes. No cranial nerve deficit. He exhibits normal muscle tone. Coordination normal.  Skin: Skin is warm and dry. No rash noted. He is not diaphoretic. No erythema. No pallor.  Psychiatric: He has a normal mood and affect. His behavior is normal. Judgment and thought content normal.     EKG w/PAC     Assessment & Plan:

## 2013-03-09 ENCOUNTER — Other Ambulatory Visit: Payer: Self-pay | Admitting: Internal Medicine

## 2013-03-09 DIAGNOSIS — Z0000 Encounter for general adult medical examination without abnormal findings: Secondary | ICD-10-CM

## 2013-03-09 DIAGNOSIS — Z125 Encounter for screening for malignant neoplasm of prostate: Secondary | ICD-10-CM

## 2013-04-04 ENCOUNTER — Ambulatory Visit (INDEPENDENT_AMBULATORY_CARE_PROVIDER_SITE_OTHER): Payer: BC Managed Care – PPO | Admitting: Sports Medicine

## 2013-04-04 VITALS — BP 147/87 | Ht 65.0 in | Wt 185.0 lb

## 2013-04-04 DIAGNOSIS — M25519 Pain in unspecified shoulder: Secondary | ICD-10-CM

## 2013-04-04 DIAGNOSIS — M25511 Pain in right shoulder: Secondary | ICD-10-CM

## 2013-04-05 ENCOUNTER — Other Ambulatory Visit: Payer: Self-pay | Admitting: *Deleted

## 2013-04-05 MED ORDER — DICLOFENAC SODIUM 1 % TD GEL: 2.0000 g | Freq: Four times a day (QID) | TRANSDERMAL | Status: DC | PRN

## 2013-04-05 MED ORDER — NABUMETONE 750 MG PO TABS: 750.0000 mg | ORAL_TABLET | Freq: Two times a day (BID) | ORAL | Status: DC

## 2013-04-05 NOTE — Progress Notes (Addendum)
  Subjective:    Patient ID: Justin Wong, male    DOB: 1961/01/30, 52 y.o.   MRN: [id number]  HPI chief complaint: Right shoulder pain  Very pleasant 52 year old male comes in today complaining of 5 weeks of right shoulder pain. No specific trauma that he can recall but gradual onset of pain that he localizes to the lateral shoulder. It is most noticeable when reaching out away from his body. He works in [employer] which requires him to carry a rather heavy bag sometimes in awkward positions. He had a similar problem in the left shoulder several years ago. I treated him at [facility] with some type of topical anti-inflammatory and some pills. He was also educated in a [facility] home exercise program. His pain resolved with this treatment. His current pain is similar in nature to what he experienced at that time. Denies any neck pain. No associated numbness or tingling. He is getting some pain at night when sleeping on the right shoulder. No prior shoulder surgeries. He is here today with his wife.  Current medications and past medical history are all reviewed. He has seasonal allergies but is otherwise healthy. No known drug allergies Socially he does not smoke, drinks one alcoholic beverage a day, and works in plumbing/HVAC.    Review of Systems     Objective:   Physical Exam Well-developed, well-nourished. No acute distress. Awake alert and oriented x3  Right shoulder: Full range of motion with a positive painful ARC. No tenderness to palpation along the clavicle or over the a.c. joint. No tenderness over the bicipital groove. Rotator cuff strength is 5/5 but reproducible of pain with resisted supraspinatus. Mild pain with resisted external rotation. Positive empty can, positive Hawkins. Positive O'Brien's. Negative apprehension. Negative Spurling's. Neurovascularly intact distally.  MSK ultrasound of the right shoulder: Images obtained in both long and short view. Biceps tendon is  well visualized in the bicipital groove and appears to be within normal limits. Supraspinatus and subscapularis also appear to be within normal limits. There is some hypoechoic changes in the insertion of the infraspinatus onto the humeral head concerning for tendinopathy and possibly a tear here.       Assessment & Plan:  1. Shoulder pain likely secondary to rotator cuff tendinopathy versus rotator cuff tear  I discussed the possibility of a cortisone injection but the patient is terrified of needles. I've requested the records from [facility]. We will repeat the same treatment that we did a few years ago for the left shoulder. He is reeducated in a [facility] home exercise program and will followup with me in 3-4 weeks. If symptoms persist we may need to consider merits of further diagnostic imaging. Call with questions or concerns in the interim.  Addendum: I reviewed the records from [facility]. I will call in a refill on Relafen 750 mg to take twice daily with food. I do not see any record of a topical anti-inflammatory but if the patient can find out exactly what it was I would be happy to call in a refill for this as well.

## 2013-05-02 ENCOUNTER — Ambulatory Visit (INDEPENDENT_AMBULATORY_CARE_PROVIDER_SITE_OTHER): Payer: BC Managed Care – PPO | Admitting: Sports Medicine

## 2013-05-02 ENCOUNTER — Ambulatory Visit
Admission: RE | Admit: 2013-05-02 | Discharge: 2013-05-02 | Disposition: A | Discharge status: Home / Self Care | Payer: BC Managed Care – PPO | Source: Ambulatory Visit | Attending: Sports Medicine | Admitting: Sports Medicine

## 2013-05-02 VITALS — BP 129/86 | Ht 65.0 in | Wt 185.0 lb

## 2013-05-02 DIAGNOSIS — M25511 Pain in right shoulder: Secondary | ICD-10-CM

## 2013-05-02 DIAGNOSIS — M25519 Pain in unspecified shoulder: Secondary | ICD-10-CM

## 2013-05-02 MED ORDER — DICLOFENAC SODIUM 1 % TD GEL: 2.0000 g | Freq: Four times a day (QID) | TRANSDERMAL | Status: DC

## 2013-05-02 NOTE — Progress Notes (Addendum)
Subjective:   Patient ID: Justin Wong, male DOB: 1961/03/29, 52 y.o. MRN: [id number]  HPI chief complaint: follow up on right shoulder pain.   52 yo male who was evaluated for right shoulder pain on 04/04/2013 presents for follow up. He has been taking Relafen 750mg  bid without significant improvement. He was not able to fill the voltaren gel. He reports pain to be similar in nature and intensity as 1 month ago. He describes the pain as a dull, constant ache located at the posterior aspect of his shoulder. Pain is worst when he reaches out his arm in front of him or reaches above his head. He reports sometimes feeling a catching sensation. He also states that he occasionally has pain at night time if he sleeps on his shoulder.   He has had pain in his left shoulder in the past, but he states that the pain he is feeling in his right shoulder is different in nature.  He denies any numbness or tingling. No neck pain.   Medications and allergies reviewed. NKDA Social: denies tobacco, alcohol or drug use  Works in plumbing.   Objective:   Physical Exam  Well-developed, well-nourished. No acute distress. Awake alert and oriented x3  Right shoulder: Full range of motion with a positive painful ARC. No tenderness to palpation along the clavicle or over the a.c. joint. No tenderness over the bicipital groove.  Positive empty can. Positive O'Brien's. Negative apprehension. Negative Spurling's. Positive crank test. Rotator cuff strength is 5/5. Neurovascularly intact distally.  MSK ultrasound of the right shoulder: Once again seen is a small hypoechoic area at the insertion of the infraspinatus tendon. Labrum is difficult to visualize due to patient's body habitus  X-rays of the right shoulder show no significant degenerative changes and nothing acute. Assessment & Plan:   1. Shoulder pain: rule out labral tear.   - will obtain MRI arthrogram to better assess labrum - voltaren gel: costs $55 out of  pocket which patient thinks he can afford.  -Phone followup after I reviewed the MRI arthrogram. If labral tear is confirmed, consider referral to orthopedics.

## 2013-05-10 ENCOUNTER — Other Ambulatory Visit: Payer: Self-pay | Admitting: Sports Medicine

## 2013-05-10 DIAGNOSIS — Z139 Encounter for screening, unspecified: Secondary | ICD-10-CM

## 2013-05-11 ENCOUNTER — Ambulatory Visit
Admission: RE | Admit: 2013-05-11 | Discharge: 2013-05-11 | Disposition: A | Discharge status: Home / Self Care | Payer: BC Managed Care – PPO | Source: Ambulatory Visit | Attending: Sports Medicine | Admitting: Sports Medicine

## 2013-05-11 DIAGNOSIS — M25511 Pain in right shoulder: Secondary | ICD-10-CM

## 2013-05-11 DIAGNOSIS — Z139 Encounter for screening, unspecified: Secondary | ICD-10-CM

## 2013-05-11 MED ORDER — IOHEXOL 180 MG/ML  SOLN
15.0000 mL | Freq: Once | INTRAMUSCULAR | Status: AC | PRN
  Administered 2013-05-11: 15 mL via INTRA_ARTICULAR

## 2013-05-16 ENCOUNTER — Telehealth: Payer: Self-pay | Admitting: Sports Medicine

## 2013-05-16 NOTE — Telephone Encounter (Signed)
I spoke with the patient on the phone last week regarding MRI findings of his shoulder. He has a full-thickness rotator cuff tear as well as a SLAP tear. Definitive treatment is surgical. I will refer the patient to Dr. Eulah Pont to discuss this further with further workup and treatment per Dr. Greig Right discretion. Patient will followup with me when necessary.

## 2014-02-24 ENCOUNTER — Other Ambulatory Visit (INDEPENDENT_AMBULATORY_CARE_PROVIDER_SITE_OTHER): Payer: BC Managed Care – PPO

## 2014-02-24 ENCOUNTER — Encounter: Payer: Self-pay | Admitting: Internal Medicine

## 2014-02-24 ENCOUNTER — Ambulatory Visit (INDEPENDENT_AMBULATORY_CARE_PROVIDER_SITE_OTHER): Payer: BC Managed Care – PPO | Admitting: Internal Medicine

## 2014-02-24 VITALS — BP 130/94 | HR 80 | Temp 98.3°F | Resp 16 | Ht 65.0 in | Wt 193.0 lb

## 2014-02-24 DIAGNOSIS — Z0000 Encounter for general adult medical examination without abnormal findings: Secondary | ICD-10-CM

## 2014-02-24 DIAGNOSIS — Z23 Encounter for immunization: Secondary | ICD-10-CM

## 2014-02-24 DIAGNOSIS — M25519 Pain in unspecified shoulder: Secondary | ICD-10-CM | POA: Insufficient documentation

## 2014-02-24 DIAGNOSIS — K635 Polyp of colon: Secondary | ICD-10-CM

## 2014-02-24 DIAGNOSIS — J45901 Unspecified asthma with (acute) exacerbation: Secondary | ICD-10-CM

## 2014-02-24 LAB — NMR LIPOPROFILE WITHOUT LIPIDS
HDL PARTICLE NUMBER: 35 umol/L (ref >=30.5)
HDL SIZE: 9.5 nm (ref >=9.2)
LARGE HDL: 3.6 umol/L — AB (ref >=4.8)
LARGE VLDL-P: 5.5 nmol/L — AB (ref <=2.7)
LDL Particle Number: 1954 nmol/L — ABNORMAL HIGH (ref <1000)
LDL Size: 19.9 nm — ABNORMAL LOW (ref >20.5)
LP-IR Score: 61 — ABNORMAL HIGH (ref <=45)
Small LDL Particle Number: 1443 nmol/L — ABNORMAL HIGH (ref <=527)
VLDL Size: 52.4 nm — ABNORMAL HIGH (ref <=46.6)

## 2014-02-24 LAB — BASIC METABOLIC PANEL
BUN: 19 mg/dL (ref 6–23)
CALCIUM: 9.7 mg/dL (ref 8.4–10.5)
CHLORIDE: 102 meq/L (ref 96–112)
CO2: 28 meq/L (ref 19–32)
CREATININE: 1 mg/dL (ref 0.4–1.5)
GFR: 86.22 mL/min (ref >60.00)
GLUCOSE: 96 mg/dL (ref 70–99)
Potassium: 4.4 mEq/L (ref 3.5–5.1)
Sodium: 136 mEq/L (ref 135–145)

## 2014-02-24 LAB — URINALYSIS
Bilirubin Urine: NEGATIVE
Hgb urine dipstick: NEGATIVE
Ketones, ur: NEGATIVE
LEUKOCYTES UA: NEGATIVE
NITRITE: NEGATIVE
PH: 7 (ref 5.0–8.0)
SPECIFIC GRAVITY, URINE: 1.015 (ref 1.000–1.030)
Total Protein, Urine: NEGATIVE
UROBILINOGEN UA: 0.2 (ref 0.0–1.0)
Urine Glucose: NEGATIVE

## 2014-02-24 LAB — HEPATIC FUNCTION PANEL
ALBUMIN: 4.5 g/dL (ref 3.5–5.2)
ALK PHOS: 78 U/L (ref 39–117)
ALT: 38 U/L (ref 0–53)
AST: 31 U/L (ref 0–37)
Bilirubin, Direct: 0.1 mg/dL (ref 0.0–0.3)
TOTAL PROTEIN: 7 g/dL (ref 6.0–8.3)
Total Bilirubin: 0.7 mg/dL (ref 0.3–1.2)

## 2014-02-24 LAB — CBC WITH DIFFERENTIAL/PLATELET
BASOS ABS: 0 10*3/uL (ref 0.0–0.1)
BASOS PCT: 0.6 % (ref 0.0–3.0)
EOS ABS: 0.4 10*3/uL (ref 0.0–0.7)
Eosinophils Relative: 6.6 % — ABNORMAL HIGH (ref 0.0–5.0)
HCT: 44 % (ref 39.0–52.0)
HEMOGLOBIN: 15 g/dL (ref 13.0–17.0)
LYMPHS PCT: 20.9 % (ref 12.0–46.0)
Lymphs Abs: 1.3 10*3/uL (ref 0.7–4.0)
MCHC: 34 g/dL (ref 30.0–36.0)
MCV: 92 fl (ref 78.0–100.0)
MONO ABS: 0.5 10*3/uL (ref 0.1–1.0)
Monocytes Relative: 8 % (ref 3.0–12.0)
NEUTROS ABS: 4.1 10*3/uL (ref 1.4–7.7)
NEUTROS PCT: 63.9 % (ref 43.0–77.0)
Platelets: 295 10*3/uL (ref 150.0–400.0)
RBC: 4.79 Mil/uL (ref 4.22–5.81)
RDW: 12.9 % (ref 11.5–14.6)
WBC: 6.4 10*3/uL (ref 4.5–10.5)

## 2014-02-24 LAB — PSA: PSA: 0.69 ng/mL (ref 0.10–4.00)

## 2014-02-24 LAB — TSH: TSH: 2.17 u[IU]/mL (ref 0.35–5.50)

## 2014-02-24 MED ORDER — FLUTICASONE-SALMETEROL 100-50 MCG/DOSE IN AEPB: 1.0000 | INHALATION_SPRAY | Freq: Two times a day (BID) | RESPIRATORY_TRACT | Status: DC

## 2014-02-24 MED ORDER — ALBUTEROL SULFATE HFA 108 (90 BASE) MCG/ACT IN AERS: 2.0000 | INHALATION_SPRAY | Freq: Four times a day (QID) | RESPIRATORY_TRACT | Status: DC | PRN

## 2014-02-24 MED ORDER — VITAMIN D 1000 UNITS PO TABS: 1000.0000 [IU] | ORAL_TABLET | Freq: Every day | ORAL | Status: AC

## 2014-02-24 NOTE — Assessment & Plan Note (Signed)
2014 rot cuff and biceps tendon repair Dr Percell Miller Doing better

## 2014-02-24 NOTE — Progress Notes (Signed)
Pre visit review using our clinic review tool, if applicable. No additional management support is needed unless otherwise documented below in the visit note. 

## 2014-02-26 ENCOUNTER — Encounter: Payer: Self-pay | Admitting: Internal Medicine

## 2014-02-26 NOTE — Assessment & Plan Note (Signed)
We discussed age appropriate health related issues, including available/recomended screening tests and vaccinations. We discussed a need for adhering to healthy diet and exercise. Labs/EKG were reviewed/ordered. All questions were answered.   

## 2014-02-26 NOTE — Progress Notes (Signed)
   Subjective:  HPI  The patient is here for a wellness exam. He had a shoulder surgery.. BP is nl at home.  Wt Readings from Last 3 Encounters:  02/24/14 193 lb (87.544 kg)  05/02/13 185 lb (83.915 kg)  04/04/13 185 lb (83.915 kg)   BP Readings from Last 3 Encounters:  02/24/14 130/94  05/02/13 129/86  04/04/13 147/87      Review of Systems  Constitutional: Negative for appetite change, fatigue and unexpected weight change.  HENT: Negative for congestion, nosebleeds, sneezing, sore throat and trouble swallowing.   Eyes: Negative for itching and visual disturbance.  Respiratory: Negative for cough.   Cardiovascular: Negative for chest pain, palpitations and leg swelling.  Gastrointestinal: Negative for nausea, diarrhea, blood in stool and abdominal distention.  Genitourinary: Negative for frequency and hematuria.  Musculoskeletal: Negative for back pain, gait problem, joint swelling and neck pain.  Skin: Negative for rash.  Neurological: Negative for dizziness, tremors, speech difficulty and weakness.  Psychiatric/Behavioral: Negative for suicidal ideas, sleep disturbance, dysphoric mood and agitation. The patient is not nervous/anxious.        Objective:   Physical Exam  Constitutional: He is oriented to person, place, and time. He appears well-developed and well-nourished. No distress.  HENT:  Head: Normocephalic and atraumatic.  Right Ear: External ear normal.  Left Ear: External ear normal.  Nose: Nose normal.  Mouth/Throat: Oropharynx is clear and moist. No oropharyngeal exudate.  Eyes: Conjunctivae and EOM are normal. Pupils are equal, round, and reactive to light. Right eye exhibits no discharge. Left eye exhibits no discharge. No scleral icterus.  Neck: Normal range of motion. Neck supple. No JVD present. No tracheal deviation present. No thyromegaly present.  Cardiovascular: Normal rate, regular rhythm, normal heart sounds and intact distal pulses.  Exam reveals  no gallop and no friction rub.   No murmur heard. Pulmonary/Chest: Effort normal and breath sounds normal. No stridor. No respiratory distress. He has no wheezes. He has no rales. He exhibits no tenderness.  Abdominal: Soft. Bowel sounds are normal. He exhibits no distension and no mass. There is no tenderness. There is no rebound and no guarding.  Genitourinary: Rectum normal, prostate normal and penis normal. Guaiac negative stool. No penile tenderness.  Musculoskeletal: Normal range of motion. He exhibits no edema and no tenderness.  Lymphadenopathy:    He has no cervical adenopathy.  Neurological: He is alert and oriented to person, place, and time. He has normal reflexes. No cranial nerve deficit. He exhibits normal muscle tone. Coordination normal.  Skin: Skin is warm and dry. No rash noted. He is not diaphoretic. No erythema. No pallor.  Psychiatric: He has a normal mood and affect. His behavior is normal. Judgment and thought content normal.   Lab Results  Component Value Date   WBC 6.4 02/24/2014   HGB 15.0 02/24/2014   HCT 44.0 02/24/2014   PLT 295.0 02/24/2014   GLUCOSE 96 02/24/2014   CHOL 194 02/21/2013   TRIG 75.0 02/21/2013   HDL 41.90 02/21/2013   LDLDIRECT 150.1 02/17/2012   LDLCALC 137* 02/21/2013   ALT 38 02/24/2014   AST 31 02/24/2014   NA 136 02/24/2014   K 4.4 02/24/2014   CL 102 02/24/2014   CREATININE 1.0 02/24/2014   BUN 19 02/24/2014   CO2 28 02/24/2014   TSH 2.17 02/24/2014   PSA 0.69 02/24/2014     EKG w/PAC     Assessment & Plan:

## 2014-02-26 NOTE — Assessment & Plan Note (Signed)
Due colon 2016

## 2015-01-18 ENCOUNTER — Encounter: Payer: Self-pay | Admitting: Family

## 2015-01-18 ENCOUNTER — Ambulatory Visit: Payer: Self-pay | Admitting: Family

## 2015-01-18 ENCOUNTER — Ambulatory Visit (INDEPENDENT_AMBULATORY_CARE_PROVIDER_SITE_OTHER): Payer: BLUE CROSS/BLUE SHIELD | Admitting: Family

## 2015-01-18 DIAGNOSIS — J209 Acute bronchitis, unspecified: Secondary | ICD-10-CM

## 2015-01-18 MED ORDER — AZITHROMYCIN 250 MG PO TABS: ORAL_TABLET | ORAL | Status: DC

## 2015-01-18 NOTE — Progress Notes (Signed)
Pre visit review using our clinic review tool, if applicable. No additional management support is needed unless otherwise documented below in the visit note. 

## 2015-01-18 NOTE — Progress Notes (Signed)
   Subjective:    Patient ID: Justin Wong, male    DOB: [birth year], 54 y.o.   MRN: [id number]  Chief Complaint  Patient presents with  . Cough    productive cough, congestion, drainage, x3 days    HPI:  Justin Wong is a 54 y.o. male who presents today for an acute visit.   This is a new problem. Associated symptoms of productive cough, congestion and drainage has been going on for 3 days. States he believes that he has bronchitis. Has taken Coldeeze lozenges and a multisymptom pill which included Tylenol, mucinex and phenylephrine which help minimally. Denies any recent antibiotic use.    No Known Allergies   Current Outpatient Prescriptions on File Prior to Visit  Medication Sig Dispense Refill  . albuterol (PROVENTIL HFA;VENTOLIN HFA) 108 (90 BASE) MCG/ACT inhaler Inhale 2 puffs into the lungs every 6 (six) hours as needed for wheezing. 1 Inhaler 2  . cetirizine-pseudoephedrine (ZYRTEC-D) 5-120 MG per tablet Take 1 tablet by mouth 2 (two) times daily.    . cholecalciferol (VITAMIN D) 1000 UNITS tablet Take 1 tablet (1,000 Units total) by mouth daily. 100 tablet 3  . fluticasone (FLONASE) 50 MCG/ACT nasal spray Place 2 sprays into the nose daily.    . Fluticasone-Salmeterol (ADVAIR DISKUS) 100-50 MCG/DOSE AEPB Inhale 1 puff into the lungs 2 (two) times daily. 60 each 11  . Omega-3 Fatty Acids (FISH OIL) 1000 MG CAPS Take 1 capsule by mouth daily.     No current facility-administered medications on file prior to visit.    Review of Systems  Constitutional: Negative for fever and chills.  HENT: Positive for congestion. Negative for sinus pressure and sore throat.   Respiratory: Positive for cough. Negative for chest tightness and shortness of breath.   Neurological: Negative for headaches.      Objective:    BP 162/90 mmHg  Pulse 86  Temp(Src) 98.3 F (36.8 C) (Oral)  Resp 18  Ht 5\' 5"  (1.651 m)  Wt 152 lb (68.947 kg)  BMI 25.29 kg/m2  SpO2 96% Nursing note  and vital signs reviewed.  Physical Exam  Constitutional: He is oriented to person, place, and time. He appears well-developed and well-nourished. No distress.  HENT:  Right Ear: Hearing, tympanic membrane, external ear and ear canal normal.  Left Ear: Hearing, tympanic membrane, external ear and ear canal normal.  Nose: Right sinus exhibits no maxillary sinus tenderness and no frontal sinus tenderness. Left sinus exhibits no maxillary sinus tenderness and no frontal sinus tenderness.  Mouth/Throat: Uvula is midline, oropharynx is clear and moist and mucous membranes are normal.  Cardiovascular: Normal rate, regular rhythm, normal heart sounds and intact distal pulses.   Pulmonary/Chest: Effort normal and breath sounds normal.  Neurological: He is alert and oriented to person, place, and time.  Skin: Skin is warm and dry.  Psychiatric: He has a normal mood and affect. His behavior is normal. Judgment and thought content normal.       Assessment & Plan:

## 2015-01-18 NOTE — Assessment & Plan Note (Deleted)
Symptoms and exam consistent

## 2015-01-18 NOTE — Patient Instructions (Addendum)
Thank you for choosing [facility].  Summary/Instructions:  Your prescription(s) have been submitted to your pharmacy or been printed and provided for you. Please take as directed and contact our office if you believe you are having problem(s) with the medication(s) or have any questions.  If your symptoms worsen or fail to improve, please contact our office for further instruction, or in case of emergency go directly to the emergency room at the closest medical facility.   General Recommendations:    Please drink plenty of fluids.  Get plenty of rest   Sleep in humidified air  Use saline nasal sprays  Netti pot   OTC Medications:  Decongestants - helps relieve congestion   Flonase (generic fluticasone) or Nasacort (generic triamcinolone) - please make sure to use the "cross-over" technique at a 45 degree angle towards the opposite eye as opposed to straight up the nasal passageway.   If you have HIGH BLOOD PRESSURE - Coricidin HBP; AVOID any product that is -D as this contains pseudoephedrine which may increase your blood pressure.  Afrin (oxymetazoline) every 6-8 hours for up to 3 days.   Allergies - helps relieve runny nose, itchy eyes and sneezing   Claritin (generic loratidine), Allegra (fexofenidine), or Zyrtec (generic cyrterizine) for runny nose. These medications should not cause drowsiness.  Note - Benadryl (generic diphenhydramine) may be used however may cause drowsiness  Cough -   Delsym or Robitussin (generic dextromethorphan)  Expectorants - helps loosen mucus to ease removal   Mucinex (generic guaifenesin) as directed on the package.  Headaches / General Aches   Tylenol (generic acetaminophen) - DO NOT EXCEED 3 grams (3,000 mg) in a 24 hour time period  Advil/Motrin (generic ibuprofen)   Sore Throat -   Salt water gargle   Chloraseptic (generic benzocaine) spray or lozenges / Sucrets (generic dyclonine)      

## 2015-01-18 NOTE — Assessment & Plan Note (Signed)
Symptoms and exam consistent with potential asthmatic bronchitis. Start azithromycin. Continue use of previously prescribed albuterol and Advair at current dosages. Continue over-the-counter medications as needed for symptom relief and supportive care. Follow-up if symptoms worsen or fail to improve.

## 2015-03-06 ENCOUNTER — Other Ambulatory Visit (INDEPENDENT_AMBULATORY_CARE_PROVIDER_SITE_OTHER): Payer: BLUE CROSS/BLUE SHIELD

## 2015-03-06 ENCOUNTER — Encounter: Payer: Self-pay | Admitting: Internal Medicine

## 2015-03-06 ENCOUNTER — Encounter: Payer: Self-pay | Admitting: Gastroenterology

## 2015-03-06 ENCOUNTER — Ambulatory Visit (INDEPENDENT_AMBULATORY_CARE_PROVIDER_SITE_OTHER): Payer: BLUE CROSS/BLUE SHIELD | Admitting: Internal Medicine

## 2015-03-06 VITALS — BP 148/88 | HR 70 | Temp 97.8°F | Resp 16 | Ht 65.0 in | Wt 192.0 lb

## 2015-03-06 DIAGNOSIS — K635 Polyp of colon: Secondary | ICD-10-CM

## 2015-03-06 DIAGNOSIS — Z0000 Encounter for general adult medical examination without abnormal findings: Secondary | ICD-10-CM

## 2015-03-06 DIAGNOSIS — R030 Elevated blood-pressure reading, without diagnosis of hypertension: Secondary | ICD-10-CM

## 2015-03-06 DIAGNOSIS — D485 Neoplasm of uncertain behavior of skin: Secondary | ICD-10-CM

## 2015-03-06 DIAGNOSIS — IMO0001 Reserved for inherently not codable concepts without codable children: Secondary | ICD-10-CM

## 2015-03-06 LAB — BASIC METABOLIC PANEL
BUN: 15 mg/dL (ref 6–23)
CHLORIDE: 103 meq/L (ref 96–112)
CO2: 30 meq/L (ref 19–32)
Calcium: 10.6 mg/dL — ABNORMAL HIGH (ref 8.4–10.5)
Creatinine, Ser: 1.07 mg/dL (ref 0.40–1.50)
GFR: 76.68 mL/min (ref >60.00)
GLUCOSE: 99 mg/dL (ref 70–99)
POTASSIUM: 4.7 meq/L (ref 3.5–5.1)
Sodium: 138 mEq/L (ref 135–145)

## 2015-03-06 LAB — CBC WITH DIFFERENTIAL/PLATELET
Basophils Absolute: 0.1 10*3/uL (ref 0.0–0.1)
Basophils Relative: 0.9 % (ref 0.0–3.0)
EOS ABS: 0.6 10*3/uL (ref 0.0–0.7)
EOS PCT: 8.9 % — AB (ref 0.0–5.0)
HEMATOCRIT: 42.7 % (ref 39.0–52.0)
HEMOGLOBIN: 14.7 g/dL (ref 13.0–17.0)
LYMPHS ABS: 1.6 10*3/uL (ref 0.7–4.0)
LYMPHS PCT: 24.6 % (ref 12.0–46.0)
MCHC: 34.4 g/dL (ref 30.0–36.0)
MCV: 89.7 fl (ref 78.0–100.0)
MONOS PCT: 8.4 % (ref 3.0–12.0)
Monocytes Absolute: 0.5 10*3/uL (ref 0.1–1.0)
Neutro Abs: 3.7 10*3/uL (ref 1.4–7.7)
Neutrophils Relative %: 57.2 % (ref 43.0–77.0)
PLATELETS: 272 10*3/uL (ref 150.0–400.0)
RBC: 4.76 Mil/uL (ref 4.22–5.81)
RDW: 13.9 % (ref 11.5–15.5)
WBC: 6.5 10*3/uL (ref 4.0–10.5)

## 2015-03-06 LAB — HEPATIC FUNCTION PANEL
ALBUMIN: 4.5 g/dL (ref 3.5–5.2)
ALK PHOS: 80 U/L (ref 39–117)
ALT: 34 U/L (ref 0–53)
AST: 22 U/L (ref 0–37)
Bilirubin, Direct: 0.1 mg/dL (ref 0.0–0.3)
TOTAL PROTEIN: 7.1 g/dL (ref 6.0–8.3)
Total Bilirubin: 0.6 mg/dL (ref 0.2–1.2)

## 2015-03-06 LAB — URINALYSIS
Bilirubin Urine: NEGATIVE
Hgb urine dipstick: NEGATIVE
KETONES UR: NEGATIVE
Leukocytes, UA: NEGATIVE
NITRITE: NEGATIVE
PH: 7.5 (ref 5.0–8.0)
SPECIFIC GRAVITY, URINE: 1.01 (ref 1.000–1.030)
Total Protein, Urine: NEGATIVE
URINE GLUCOSE: NEGATIVE
Urobilinogen, UA: 0.2 (ref 0.0–1.0)

## 2015-03-06 LAB — TSH: TSH: 1.78 u[IU]/mL (ref 0.35–4.50)

## 2015-03-06 LAB — LIPID PANEL
CHOL/HDL RATIO: 6
Cholesterol: 224 mg/dL — ABNORMAL HIGH (ref 0–200)
HDL: 39.6 mg/dL (ref >39.00)
LDL CALC: 162 mg/dL — AB (ref 0–99)
NonHDL: 184.4
Triglycerides: 111 mg/dL (ref 0.0–149.0)
VLDL: 22.2 mg/dL (ref 0.0–40.0)

## 2015-03-06 LAB — PSA: PSA: 0.78 ng/mL (ref 0.10–4.00)

## 2015-03-06 LAB — HEMOGLOBIN A1C: HEMOGLOBIN A1C: 5.8 % (ref 4.6–6.5)

## 2015-03-06 MED ORDER — FLUTICASONE-SALMETEROL 100-50 MCG/DOSE IN AEPB: 1.0000 | INHALATION_SPRAY | Freq: Two times a day (BID) | RESPIRATORY_TRACT | Status: DC

## 2015-03-06 MED ORDER — LOSARTAN POTASSIUM 100 MG PO TABS: 100.0000 mg | ORAL_TABLET | Freq: Every day | ORAL | Status: DC

## 2015-03-06 MED ORDER — ALBUTEROL SULFATE HFA 108 (90 BASE) MCG/ACT IN AERS: 2.0000 | INHALATION_SPRAY | Freq: Four times a day (QID) | RESPIRATORY_TRACT | Status: DC | PRN

## 2015-03-06 NOTE — Assessment & Plan Note (Signed)
We discussed age appropriate health related issues, including available/recomended screening tests and vaccinations. We discussed a need for adhering to healthy diet and exercise. Labs/EKG were reviewed/ordered. All questions were answered.   

## 2015-03-06 NOTE — Patient Instructions (Signed)
Start Losartan if BP>140/90

## 2015-03-06 NOTE — Progress Notes (Signed)
Pre visit review using our clinic review tool, if applicable. No additional management support is needed unless otherwise documented below in the visit note. 

## 2015-03-06 NOTE — Assessment & Plan Note (Signed)
Colon w/Dr Fuller Plan

## 2015-03-06 NOTE — Assessment & Plan Note (Signed)
R mid-forearm 6 mm bluish NT nodule Skin bx

## 2015-03-06 NOTE — Progress Notes (Signed)
Subjective:  HPI  The patient is here for a wellness exam. C/o skin spot on R forearm x 1-2 mo.. BP is nl at home.  Wt Readings from Last 3 Encounters:  03/06/15 192 lb (87.091 kg)  01/18/15 152 lb (68.947 kg)  02/24/14 193 lb (87.544 kg)   BP Readings from Last 3 Encounters:  03/06/15 148/88  01/18/15 162/90  02/24/14 130/94      Review of Systems  Constitutional: Negative for appetite change, fatigue and unexpected weight change.  HENT: Negative for congestion, nosebleeds, sneezing, sore throat and trouble swallowing.   Eyes: Negative for itching and visual disturbance.  Respiratory: Negative for cough.   Cardiovascular: Negative for chest pain, palpitations and leg swelling.  Gastrointestinal: Negative for nausea, diarrhea, blood in stool and abdominal distention.  Genitourinary: Negative for frequency and hematuria.  Musculoskeletal: Negative for back pain, joint swelling, gait problem and neck pain.  Skin: Negative for rash.  Neurological: Negative for dizziness, tremors, speech difficulty and weakness.  Psychiatric/Behavioral: Negative for suicidal ideas, sleep disturbance, dysphoric mood and agitation. The patient is not nervous/anxious.        Objective:   Physical Exam  Constitutional: He is oriented to person, place, and time. He appears well-developed and well-nourished. No distress.  HENT:  Head: Normocephalic and atraumatic.  Right Ear: External ear normal.  Left Ear: External ear normal.  Nose: Nose normal.  Mouth/Throat: Oropharynx is clear and moist. No oropharyngeal exudate.  Eyes: Conjunctivae and EOM are normal. Pupils are equal, round, and reactive to light. Right eye exhibits no discharge. Left eye exhibits no discharge. No scleral icterus.  Neck: Normal range of motion. Neck supple. No JVD present. No tracheal deviation present. No thyromegaly present.  Cardiovascular: Normal rate, regular rhythm, normal heart sounds and intact distal pulses.   Exam reveals no gallop and no friction rub.   No murmur heard. Pulmonary/Chest: Effort normal and breath sounds normal. No stridor. No respiratory distress. He has no wheezes. He has no rales. He exhibits no tenderness.  Abdominal: Soft. Bowel sounds are normal. He exhibits no distension and no mass. There is no tenderness. There is no rebound and no guarding.  Genitourinary: Rectum normal, prostate normal and penis normal. Guaiac negative stool. No penile tenderness.  Musculoskeletal: Normal range of motion. He exhibits no edema or tenderness.  Lymphadenopathy:    He has no cervical adenopathy.  Neurological: He is alert and oriented to person, place, and time. He has normal reflexes. No cranial nerve deficit. He exhibits normal muscle tone. Coordination normal.  Skin: Skin is warm and dry. No rash noted. He is not diaphoretic. No erythema. No pallor.  Psychiatric: He has a normal mood and affect. His behavior is normal. Judgment and thought content normal.  R mid-forearm 6 mm bluish NT nodule  Lab Results  Component Value Date   WBC 6.4 02/24/2014   HGB 15.0 02/24/2014   HCT 44.0 02/24/2014   PLT 295.0 02/24/2014   GLUCOSE 96 02/24/2014   CHOL 194 02/21/2013   TRIG 75.0 02/21/2013   HDL 41.90 02/21/2013   LDLDIRECT 150.1 02/17/2012   LDLCALC 137* 02/21/2013   ALT 38 02/24/2014   AST 31 02/24/2014   NA 136 02/24/2014   K 4.4 02/24/2014   CL 102 02/24/2014   CREATININE 1.0 02/24/2014   BUN 19 02/24/2014   CO2 28 02/24/2014   TSH 2.17 02/24/2014   PSA 0.69 02/24/2014        Assessment & Plan:

## 2015-03-06 NOTE — Assessment & Plan Note (Signed)
Start Losartan if BP is up at home

## 2015-03-07 NOTE — Progress Notes (Signed)
Pt advised of dr plotnikov's instructions, pt doesn't want to make another appt at this time, pt advised he can call back and make appt for July if he changes his mind, patient doesn't like taking any meds to lower cholesterol either

## 2015-04-13 ENCOUNTER — Telehealth: Payer: Self-pay | Admitting: Internal Medicine

## 2015-04-13 MED ORDER — FLUTICASONE-SALMETEROL 100-50 MCG/DOSE IN AEPB: 1.0000 | INHALATION_SPRAY | Freq: Two times a day (BID) | RESPIRATORY_TRACT | Status: DC

## 2015-04-13 NOTE — Telephone Encounter (Signed)
Patient would like a call if advair can be sent today.

## 2015-04-13 NOTE — Telephone Encounter (Signed)
Notified pt rx sent to [facility] 

## 2015-04-13 NOTE — Telephone Encounter (Signed)
Patient is requesting advair to be sent to [facility] in [place].

## 2015-05-07 ENCOUNTER — Ambulatory Visit (INDEPENDENT_AMBULATORY_CARE_PROVIDER_SITE_OTHER): Payer: BLUE CROSS/BLUE SHIELD | Admitting: Internal Medicine

## 2015-05-07 ENCOUNTER — Encounter: Payer: Self-pay | Admitting: Internal Medicine

## 2015-05-07 VITALS — BP 130/74 | HR 71 | Wt 193.0 lb

## 2015-05-07 DIAGNOSIS — D489 Neoplasm of uncertain behavior, unspecified: Secondary | ICD-10-CM

## 2015-05-07 NOTE — Patient Instructions (Signed)
Postprocedure instructions :    A Band-Aid should be  changed twice daily. You can take a shower tomorrow.  Keep the wounds clean. You can wash them with liquid soap and water. Pat dry with gauze or a Kleenex tissue  Before applying antibiotic ointment and a Band-Aid.   You need to report immediately  if fever, chills or any signs of infection develop.    The biopsy results should be available in 1 -2 weeks.  Suture removal on 12-14 days

## 2015-05-07 NOTE — Progress Notes (Signed)
Procedure - skin bx  Dx growing nodule on R foream  Risks including unsuccessful procedure , bleeding, infection, bruising, scar, a need for another complete procedure and others were explained to the patient in detail as well as the benefits. Informed consent was obtained and signed.   The patient was placed in a decubitus position.  Lesion on R forearm  measuring 6x4 mm   Skin over lesion was prepped with Betadine and alcohol  and anesthetized with 3 cc of 2% lidocaine and epinephrine, using a 25-gauge 1 inch needle. 1.5 cm oval incision with a sterile blade was carried out and excised. Then it was sent for pathology tests. The wound was closed with 3  4.0 external and 2 4.0 internal sutures. It was covered with antibiotic ointment.

## 2015-05-07 NOTE — Progress Notes (Signed)
Pre visit review using our clinic review tool, if applicable. No additional management support is needed unless otherwise documented below in the visit note. 

## 2015-05-21 ENCOUNTER — Ambulatory Visit (INDEPENDENT_AMBULATORY_CARE_PROVIDER_SITE_OTHER): Payer: BLUE CROSS/BLUE SHIELD | Admitting: Internal Medicine

## 2015-05-21 ENCOUNTER — Encounter: Payer: Self-pay | Admitting: Internal Medicine

## 2015-05-21 VITALS — BP 120/72 | HR 80

## 2015-05-21 DIAGNOSIS — D485 Neoplasm of uncertain behavior of skin: Secondary | ICD-10-CM

## 2015-05-21 NOTE — Progress Notes (Signed)
Pre visit review using our clinic review tool, if applicable. No additional management support is needed unless otherwise documented below in the visit note. 

## 2015-05-23 NOTE — Assessment & Plan Note (Signed)
Sutures removed.

## 2015-05-23 NOTE — Progress Notes (Signed)
Sutures removed x3

## 2015-09-10 ENCOUNTER — Telehealth: Payer: Self-pay | Admitting: Internal Medicine

## 2015-09-10 NOTE — Telephone Encounter (Signed)
Pt's spouse called stated that Dr. Camila Li remove growing nodule on R foream and now its came back. Please advise, pt is not sure what to do.

## 2015-09-10 NOTE — Telephone Encounter (Signed)
Sorry! I need to see the pt Thx

## 2015-09-12 NOTE — Telephone Encounter (Signed)
Left v/m for Nancy to call back.

## 2015-09-13 NOTE — Telephone Encounter (Signed)
Pt called back and stated he is going to see the dermatologist first and take it from there. Pt need refill for albuterol (PROVENTIL HFA;VENTOLIN HFA) 108 (90 BASE) MCG/ACT inhaler to be send into [facility]. Pt is no longer use [facility] since he move to [place]. Please help

## 2015-09-13 NOTE — Telephone Encounter (Signed)
Ok to ref Albuterol Ok to see a dermatologist Thx

## 2015-09-14 MED ORDER — ALBUTEROL SULFATE HFA 108 (90 BASE) MCG/ACT IN AERS: 2.0000 | INHALATION_SPRAY | Freq: Four times a day (QID) | RESPIRATORY_TRACT | Status: DC | PRN

## 2015-09-14 NOTE — Addendum Note (Signed)
Addended by: Cresenciano Lick on: 09/14/2015 11:51 AM   Modules accepted: Orders, Medications

## 2015-09-14 NOTE — Telephone Encounter (Signed)
Left detailed mess informing pt of below.  

## 2015-09-14 NOTE — Telephone Encounter (Signed)
Rf sent. See meds.  

## 2016-03-06 ENCOUNTER — Other Ambulatory Visit (INDEPENDENT_AMBULATORY_CARE_PROVIDER_SITE_OTHER): Payer: 59

## 2016-03-06 ENCOUNTER — Encounter: Payer: Self-pay | Admitting: Internal Medicine

## 2016-03-06 ENCOUNTER — Ambulatory Visit (INDEPENDENT_AMBULATORY_CARE_PROVIDER_SITE_OTHER): Payer: 59 | Admitting: Internal Medicine

## 2016-03-06 ENCOUNTER — Telehealth: Payer: Self-pay

## 2016-03-06 VITALS — BP 132/80 | HR 79 | Temp 98.1°F | Ht 65.0 in | Wt 196.0 lb

## 2016-03-06 DIAGNOSIS — R030 Elevated blood-pressure reading, without diagnosis of hypertension: Secondary | ICD-10-CM

## 2016-03-06 DIAGNOSIS — Z0000 Encounter for general adult medical examination without abnormal findings: Secondary | ICD-10-CM

## 2016-03-06 DIAGNOSIS — M25511 Pain in right shoulder: Secondary | ICD-10-CM

## 2016-03-06 DIAGNOSIS — M25512 Pain in left shoulder: Secondary | ICD-10-CM

## 2016-03-06 DIAGNOSIS — IMO0001 Reserved for inherently not codable concepts without codable children: Secondary | ICD-10-CM

## 2016-03-06 DIAGNOSIS — Z86010 Personal history of colonic polyps: Secondary | ICD-10-CM

## 2016-03-06 LAB — URINALYSIS
Bilirubin Urine: NEGATIVE
HGB URINE DIPSTICK: NEGATIVE
KETONES UR: NEGATIVE
Leukocytes, UA: NEGATIVE
Nitrite: NEGATIVE
TOTAL PROTEIN, URINE-UPE24: NEGATIVE
URINE GLUCOSE: NEGATIVE
UROBILINOGEN UA: 0.2 (ref 0.0–1.0)
pH: 6 (ref 5.0–8.0)

## 2016-03-06 LAB — PSA: PSA: 0.82 ng/mL (ref 0.10–4.00)

## 2016-03-06 LAB — BASIC METABOLIC PANEL
BUN: 19 mg/dL (ref 6–23)
CALCIUM: 9.9 mg/dL (ref 8.4–10.5)
CO2: 27 meq/L (ref 19–32)
CREATININE: 1.09 mg/dL (ref 0.40–1.50)
Chloride: 105 mEq/L (ref 96–112)
GFR: 74.78 mL/min (ref >60.00)
Glucose, Bld: 94 mg/dL (ref 70–99)
Potassium: 5.3 mEq/L — ABNORMAL HIGH (ref 3.5–5.1)
Sodium: 138 mEq/L (ref 135–145)

## 2016-03-06 LAB — TSH: TSH: 2.03 u[IU]/mL (ref 0.35–4.50)

## 2016-03-06 LAB — CBC WITH DIFFERENTIAL/PLATELET
Basophils Absolute: 0 10*3/uL (ref 0.0–0.1)
Basophils Relative: 0.5 % (ref 0.0–3.0)
Eosinophils Absolute: 0.5 10*3/uL (ref 0.0–0.7)
Eosinophils Relative: 7.6 % — ABNORMAL HIGH (ref 0.0–5.0)
HEMATOCRIT: 42.9 % (ref 39.0–52.0)
Hemoglobin: 14.7 g/dL (ref 13.0–17.0)
Lymphocytes Relative: 22.9 % (ref 12.0–46.0)
Lymphs Abs: 1.4 10*3/uL (ref 0.7–4.0)
MCHC: 34.3 g/dL (ref 30.0–36.0)
MCV: 90.6 fl (ref 78.0–100.0)
Monocytes Absolute: 0.5 10*3/uL (ref 0.1–1.0)
Monocytes Relative: 7.4 % (ref 3.0–12.0)
Neutro Abs: 3.8 10*3/uL (ref 1.4–7.7)
Neutrophils Relative %: 61.6 % (ref 43.0–77.0)
Platelets: 284 10*3/uL (ref 150.0–400.0)
RBC: 4.73 Mil/uL (ref 4.22–5.81)
RDW: 13.3 % (ref 11.5–15.5)
WBC: 6.2 10*3/uL (ref 4.0–10.5)

## 2016-03-06 LAB — LIPID PANEL
Cholesterol: 201 mg/dL — ABNORMAL HIGH (ref 0–200)
HDL: 42 mg/dL (ref >39.00)
LDL Cholesterol: 130 mg/dL — ABNORMAL HIGH (ref 0–99)
NONHDL: 158.67
Total CHOL/HDL Ratio: 5
Triglycerides: 144 mg/dL (ref 0.0–149.0)
VLDL: 28.8 mg/dL (ref 0.0–40.0)

## 2016-03-06 LAB — HEPATIC FUNCTION PANEL
ALBUMIN: 4.6 g/dL (ref 3.5–5.2)
ALT: 17 U/L (ref 0–53)
AST: 17 U/L (ref 0–37)
Alkaline Phosphatase: 70 U/L (ref 39–117)
BILIRUBIN DIRECT: 0.1 mg/dL (ref 0.0–0.3)
Total Bilirubin: 0.8 mg/dL (ref 0.2–1.2)
Total Protein: 7.1 g/dL (ref 6.0–8.3)

## 2016-03-06 LAB — HEPATITIS C ANTIBODY: HCV AB: NEGATIVE

## 2016-03-06 NOTE — Telephone Encounter (Signed)
LVM for pt to call back as soon as possible.   RE: Patient does need to contact [place] for colonoscopy appt.   Attached PCP and GI MD to this message as an FYI. I do not know the contact names of staff in GI that would schedule the appt.

## 2016-03-06 NOTE — Patient Instructions (Signed)
Try Melatonin at bedtime for a few nights to adjust to time change

## 2016-03-06 NOTE — Telephone Encounter (Signed)
Please contact pt regarding scheduling overdue colonoscopy

## 2016-03-06 NOTE — Telephone Encounter (Signed)
Pt returned my call and I gave him the information regarding Dr. Silvio Pate response (seen below).   Pt stated understanding and agreed to have another colonoscopy.   Contact [facility] office and spoke to [place]. Alyse Low is calling pt to schedule his colonoscopy.   Routing to PCP and GI MD to keep them in the loop.   Closing note.

## 2016-03-06 NOTE — Telephone Encounter (Signed)
See notes

## 2016-03-06 NOTE — Addendum Note (Signed)
Addended by: Cassandria Anger on: 03/06/2016 10:08 PM   Modules accepted: Orders

## 2016-03-06 NOTE — Assessment & Plan Note (Signed)
We discussed age appropriate health related issues, including available/recomended screening tests and vaccinations. We discussed a need for adhering to healthy diet and exercise. Labs/EKG were reviewed/ordered. All questions were answered. Loose 5 lbs

## 2016-03-06 NOTE — Telephone Encounter (Signed)
Ok Thx 

## 2016-03-06 NOTE — Assessment & Plan Note (Signed)
BP nl off Claritin D Monitor BP

## 2016-03-06 NOTE — Assessment & Plan Note (Signed)
2017 bursitis B shoulders - per Ortho

## 2016-03-06 NOTE — Telephone Encounter (Signed)
-----   Message from Ladene Artist, MD sent at 03/06/2016 10:21 AM EDT ----- Regarding: RE: Polypectomy results No problem. All the [facility] MDs use these letters to remind patient to return for repeat procedures so they are an easy place to check.   ----- Message -----    From: Aviva Signs, CMA    Sent: 03/06/2016   9:57 AM      To: Ladene Artist, MD Subject: RE: Polypectomy results                        I apologize for not looking there. I will contact the patient regarding the colonoscopy.  ----- Message -----    From: Ladene Artist, MD    Sent: 03/06/2016   9:44 AM      To: Aviva Signs, CMA Subject: RE: Polypectomy results                        They are overdue for a 3 year colonoscopy. You can find our letter to the patient in under the letter tab in 03/2015. Please encourage them to call to schedule a colonoscopy soon.   MS  ----- Message -----    From: Aviva Signs, CMA    Sent: 03/06/2016   8:38 AM      To: Ladene Artist, MD, Cassandria Anger, MD Subject: Polypectomy results                            Good morning,   I am sending this message to ask when this patient should have his next colonoscopy. I have in over due health maintenance that he needs is every 3 years and he has a strong family history of colon cancer. There is a polypectomy pathology results in his chart.   Please advise at your convenience. Thanks in advance for your time and attentions!  Pavan Bring/CMA

## 2016-03-06 NOTE — Progress Notes (Signed)
Subjective:  Patient ID: Justin Wong, male    DOB: [birth year]  Age: 55 y.o. MRN: [id number]  CC: Annual Exam   HPI Justin Wong presents for a well exam C/o fatigue since time change 1 mo ago  Outpatient Prescriptions Prior to Visit  Medication Sig Dispense Refill  . albuterol (PROVENTIL HFA;VENTOLIN HFA) 108 (90 BASE) MCG/ACT inhaler Inhale 2 puffs into the lungs every 6 (six) hours as needed for wheezing. 1 Inhaler 2  . Cetirizine HCl (ZYRTEC ALLERGY PO) Take by mouth daily.    . fluticasone (FLONASE) 50 MCG/ACT nasal spray Place 2 sprays into both nostrils daily.    . Fluticasone-Salmeterol (ADVAIR DISKUS) 100-50 MCG/DOSE AEPB Inhale 1 puff into the lungs 2 (two) times daily. 60 each 11  . losartan (COZAAR) 100 MG tablet Take 1 tablet (100 mg total) by mouth daily. 30 tablet 11  . Multiple Vitamins-Minerals (MENS 50+ MULTI VITAMIN/MIN) TABS Take 1 tablet by mouth daily.    . Omega-3 Fatty Acids (FISH OIL) 1000 MG CAPS Take 1 capsule by mouth daily.     No facility-administered medications prior to visit.    ROS Review of Systems  Constitutional: Positive for unexpected weight change. Negative for appetite change and fatigue.  HENT: Negative for congestion, nosebleeds, sneezing, sore throat and trouble swallowing.   Eyes: Negative for itching and visual disturbance.  Respiratory: Negative for cough.   Cardiovascular: Negative for chest pain, palpitations and leg swelling.  Gastrointestinal: Negative for nausea, diarrhea, blood in stool and abdominal distention.  Genitourinary: Negative for frequency and hematuria.  Musculoskeletal: Negative for back pain, joint swelling, gait problem and neck pain.  Skin: Negative for rash.  Neurological: Negative for dizziness, tremors, speech difficulty and weakness.  Psychiatric/Behavioral: Negative for suicidal ideas, sleep disturbance, dysphoric mood and agitation. The patient is not nervous/anxious.     Objective:  BP  132/80 mmHg  Pulse 79  Temp(Src) 98.1 F (36.7 C) (Oral)  Ht 5\' 5"  (1.651 m)  Wt 196 lb (88.905 kg)  BMI 32.62 kg/m2  SpO2 97%  BP Readings from Last 3 Encounters:  03/06/16 132/80  05/21/15 120/72  05/07/15 130/74    Wt Readings from Last 3 Encounters:  03/06/16 196 lb (88.905 kg)  05/07/15 193 lb (87.544 kg)  03/06/15 192 lb (87.091 kg)    Physical Exam  Constitutional: He is oriented to person, place, and time. He appears well-developed and well-nourished. No distress.  HENT:  Head: Normocephalic and atraumatic.  Right Ear: External ear normal.  Left Ear: External ear normal.  Nose: Nose normal.  Mouth/Throat: Oropharynx is clear and moist. No oropharyngeal exudate.  Eyes: Conjunctivae and EOM are normal. Pupils are equal, round, and reactive to light. Right eye exhibits no discharge. Left eye exhibits no discharge. No scleral icterus.  Neck: Normal range of motion. Neck supple. No JVD present. No tracheal deviation present. No thyromegaly present.  Cardiovascular: Normal rate, regular rhythm, normal heart sounds and intact distal pulses.  Exam reveals no gallop and no friction rub.   No murmur heard. Pulmonary/Chest: Effort normal and breath sounds normal. No stridor. No respiratory distress. He has no wheezes. He has no rales. He exhibits no tenderness.  Abdominal: Soft. Bowel sounds are normal. He exhibits no distension and no mass. There is no tenderness. There is no rebound and no guarding.  Genitourinary: Rectum normal, prostate normal and penis normal. Guaiac negative stool. No penile tenderness.  Musculoskeletal: Normal range of motion. He exhibits no edema  or tenderness.  Lymphadenopathy:    He has no cervical adenopathy.  Neurological: He is alert and oriented to person, place, and time. He has normal reflexes. No cranial nerve deficit. He exhibits normal muscle tone. Coordination normal.  Skin: Skin is warm and dry. No rash noted. He is not diaphoretic. No  erythema. No pallor.  Psychiatric: He has a normal mood and affect. His behavior is normal. Judgment and thought content normal.    Lab Results  Component Value Date   WBC 6.5 03/06/2015   HGB 14.7 03/06/2015   HCT 42.7 03/06/2015   PLT 272.0 03/06/2015   GLUCOSE 99 03/06/2015   CHOL 224* 03/06/2015   TRIG 111.0 03/06/2015   HDL 39.60 03/06/2015   LDLDIRECT 150.1 02/17/2012   LDLCALC 162* 03/06/2015   ALT 34 03/06/2015   AST 22 03/06/2015   NA 138 03/06/2015   K 4.7 03/06/2015   CL 103 03/06/2015   CREATININE 1.07 03/06/2015   BUN 15 03/06/2015   CO2 30 03/06/2015   TSH 1.78 03/06/2015   PSA 0.78 03/06/2015   HGBA1C 5.8 03/06/2015    Dg Eye Foreign Body  05/11/2013  *RADIOLOGY REPORT* Clinical Data: Metal exposure.  Pre MRI. ORBITS FOR FOREIGN BODY - 2 VIEW Comparison: None. Findings: No radiopaque foreign body is evident.  The visualized paranasal sinuses are clear. IMPRESSION: No radiopaque foreign body projects over either orbit. Original Report Authenticated By: San Morelle, M.D.   Mr Shoulder Right W Contrast  05/12/2013  *RADIOLOGY REPORT* Clinical Data: Right shoulder pain over the past 3 months. MRI ARTHROGRAM OF THE RIGHT SHOULDER WITH INTRA-ARTICULAR CONTRAST Technique:  Multiplanar, multisequence MR imaging of the right shoulder was performed following injection of dilute gadolinium contrast medium into the right glenohumeral joint. Comparison:  05/02/2013 radiographs FINDINGS: Despite efforts by the patient and technologist, motion artifact is present on some series of today's examination and could not be totally eliminated.  This reduces diagnostic sensitivity and specificity.  Rotator cuff:  Full-thickness partial-width tear of the distal supraspinatus tendon extends obliquely on image 8 of series 5. Partial thickness articular surface tearing of the infraspinatus is present.  Partial thickness articular surface tearing of the subscapularis is present. Muscles:   Intact Biceps long head:  Tendinopathy of the intra-articular segment. Acromioclavicular Joint:  Moderate degenerative AC joint arthropathy noted. The acromial undersurface is type 2 (curved). Glenohumeral Joint:  Intact Labrum:  Abnormal increased T1 signal in the superior labrum is compatible with mildly displaced SLAP tear. Bones:  AC joint is noted above; otherwise intact. IMPRESSION: 1.  Full-thickness partial-width tear of the distal supraspinatus tendon.  Partial thickness articular surface tearing of the infraspinatus and subscapularis. 2.  Mildly displaced SLAP tear of the glenoid labrum. 3.  Tendinopathy of the intra-articular segment the long head of the biceps. 4.  Moderate degenerative AC joint arthropathy. Original Report Authenticated By: Van Clines, M.D.   Dg Fluoro Guide Ndl Plc/bx  05/11/2013  *RADIOLOGY REPORT* Clinical data: Rotator cuff pathology RIGHT SHOULDER INJECTION UNDER FLUOROSCOPY FOR MRI Technique: The procedure, risks (including but not limited to bleeding, infection, organ damage), benefits, and alternatives were explained to the patient.  Questions regarding the procedure were encouraged and answered.  The patient understands and consents to the procedure. An appropriate skin entry site was determined under fluoroscopy. Skin site was marked, prepped with Betadine, and draped in usual sterile fashion, and infiltrated locally with 1% lidocaine. 22-gauge spinal needle advanced to the superior medial margin of the humeral  head. 1 mL of lidocaine 1% injected easily. 50ml of a mixture of 18ml lidocaine 1%, 77ml saline, and 34ml Omnipaque 180 with 0.68ml Multihance contrast was injected into the shoulder joint. Intraarticular flow was confirmed on fluoroscopy. Patient transferred to MRI. Fluoroscopy time:  11seconds IMPRESSION 1. Technically successful right shoulder injection for MRI Original Report Authenticated By: D. Wallace Going, MD    Assessment & Plan:   Justin Wong was seen  today for annual exam.  Diagnoses and all orders for this visit:  Well adult exam -     CBC with Differential/Platelet; Future -     Basic metabolic panel; Future -     Hepatic function panel; Future -     Lipid panel; Future -     PSA; Future -     TSH; Future -     Urinalysis; Future -     Hepatitis C antibody; Future  Pain of both shoulder joints  I am having Justin Wong maintain his Fish Oil, Cetirizine HCl (ZYRTEC ALLERGY PO), losartan, Fluticasone-Salmeterol, MENS 50+ MULTI VITAMIN/MIN, fluticasone, and albuterol.  No orders of the defined types were placed in this encounter.     Follow-up: Return in about 1 year (around 03/06/2017) for Wellness Exam.  Walker Kehr, MD

## 2016-04-14 ENCOUNTER — Encounter: Payer: Self-pay | Admitting: Gastroenterology

## 2016-05-01 ENCOUNTER — Other Ambulatory Visit: Payer: Self-pay | Admitting: Internal Medicine

## 2016-06-02 ENCOUNTER — Ambulatory Visit (AMBULATORY_SURGERY_CENTER): Payer: Self-pay | Admitting: *Deleted

## 2016-06-02 VITALS — Ht 65.0 in | Wt 193.0 lb

## 2016-06-02 DIAGNOSIS — Z800 Family history of malignant neoplasm of digestive organs: Secondary | ICD-10-CM

## 2016-06-02 DIAGNOSIS — Z86010 Personal history of colonic polyps: Secondary | ICD-10-CM

## 2016-06-02 MED ORDER — NA SULFATE-K SULFATE-MG SULF 17.5-3.13-1.6 GM/177ML PO SOLN: ORAL | Status: DC

## 2016-06-02 NOTE — Progress Notes (Signed)
Patient denies any allergies to eggs or soy. Patient denies any problems with anesthesia/sedation. Patient denies any oxygen use at home and does not take any diet/weight loss medications.  

## 2016-06-03 ENCOUNTER — Encounter: Payer: Self-pay | Admitting: Gastroenterology

## 2016-06-13 ENCOUNTER — Encounter: Payer: Self-pay | Admitting: Gastroenterology

## 2016-06-13 ENCOUNTER — Ambulatory Visit (AMBULATORY_SURGERY_CENTER): Payer: 59 | Admitting: Gastroenterology

## 2016-06-13 VITALS — BP 119/78 | HR 64 | Temp 97.3°F | Resp 17 | Ht 65.0 in | Wt 193.0 lb

## 2016-06-13 DIAGNOSIS — D120 Benign neoplasm of cecum: Secondary | ICD-10-CM

## 2016-06-13 DIAGNOSIS — Z86010 Personal history of colonic polyps: Secondary | ICD-10-CM

## 2016-06-13 DIAGNOSIS — K635 Polyp of colon: Secondary | ICD-10-CM | POA: Diagnosis not present

## 2016-06-13 DIAGNOSIS — D123 Benign neoplasm of transverse colon: Secondary | ICD-10-CM | POA: Diagnosis not present

## 2016-06-13 MED ORDER — SODIUM CHLORIDE 0.9 % IV SOLN: 500.0000 mL | INTRAVENOUS | Status: DC

## 2016-06-13 NOTE — Progress Notes (Signed)
To recovery awake alert vss  Report to pam rn

## 2016-06-13 NOTE — Op Note (Signed)
[facility] Patient Name: Justin Wong Procedure Date: 06/13/2016 11:39 AM MRN: [id number] Endoscopist: Ladene Artist , MD Age: 55 Referring MD:  Date of Birth: Dec 04, 1960 Gender: Male Account #: [id number] Procedure:                Colonoscopy Indications:              Surveillance: Personal history of adenomatous                            polyps on last colonoscopy > 3 years ago Medicines:                Monitored Anesthesia Care Procedure:                Pre-Anesthesia Assessment:                           - Prior to the procedure, a History and Physical                            was performed, and patient medications and                            allergies were reviewed. The patient's tolerance of                            previous anesthesia was also reviewed. The risks                            and benefits of the procedure and the sedation                            options and risks were discussed with the patient.                            All questions were answered, and informed consent                            was obtained. Prior Anticoagulants: The patient has                            taken no previous anticoagulant or antiplatelet                            agents. ASA Grade Assessment: II - A patient with                            mild systemic disease. After reviewing the risks                            and benefits, the patient was deemed in                            satisfactory condition to undergo the procedure.  After obtaining informed consent, the colonoscope                            was passed under direct vision. Throughout the                            procedure, the patient's blood pressure, pulse, and                            oxygen saturations were monitored continuously. The                            [id number] was introduced through the anus                            and advanced to the  the cecum, identified by                            appendiceal orifice and ileocecal valve. The                            ileocecal valve, appendiceal orifice, and rectum                            were photographed. The quality of the bowel                            preparation was excellent. The colonoscopy was                            performed without difficulty. The patient tolerated                            the procedure well. Scope In: 11:45:17 AM Scope Out: 11:57:14 AM Scope Withdrawal Time: 0 hours 10 minutes 43 seconds  Total Procedure Duration: 0 hours 11 minutes 57 seconds  Findings:                 Two sessile polyps were found in the transverse                            colon. The polyps were 7 mm in size. These polyps                            were removed with a cold snare. Resection and                            retrieval were complete.                           The exam was otherwise without abnormality on                            direct and retroflexion views.  A few small-mouthed diverticula were found in the                            sigmoid colon.                           Two sessile polyps were found in the transverse                            colon and cecum. The polyps were 4 to 5 mm in size.                            These polyps were removed with a cold biopsy                            forceps. Resection and retrieval were complete. Complications:            No immediate complications. Estimated blood loss:                            None. Estimated Blood Loss:     Estimated blood loss: none. Impression:               - Two 7 mm polyps in the transverse colon, removed                            with a cold snare. Resected and retrieved.                           - Two 4 to 5 mm polyps in the transverse colon and                            in the cecum, removed with a cold biopsy forceps.                             Resected and retrieved.                           - Mild sigmoid colon diverticulosis Recommendation:           - Repeat colonoscopy in 5 years for surveillance.                           - Patient has a contact number available for                            emergencies. The signs and symptoms of potential                            delayed complications were discussed with the                            patient. Return to normal activities tomorrow.  Written discharge instructions were provided to the                            patient.                           - High fiber diet.                           - Continue present medications.                           - Await pathology results. Ladene Artist, MD 06/13/2016 12:01:39 PM This report has been signed electronically.

## 2016-06-13 NOTE — Progress Notes (Signed)
Called to room to assist during endoscopic procedure.  Patient ID and intended procedure confirmed with present staff. Received instructions for my participation in the procedure from the performing physician.  

## 2016-06-13 NOTE — Patient Instructions (Signed)
YOU HAD AN ENDOSCOPIC PROCEDURE TODAY AT [facility]:   Refer to the procedure report that was given to you for any specific questions about what was found during the examination.  If the procedure report does not answer your questions, please call your gastroenterologist to clarify.  If you requested that your care partner not be given the details of your procedure findings, then the procedure report has been included in a sealed envelope for you to review at your convenience later.  YOU SHOULD EXPECT: Some feelings of bloating in the abdomen. Passage of more gas than usual.  Walking can help get rid of the air that was put into your GI tract during the procedure and reduce the bloating. If you had a lower endoscopy (such as a colonoscopy or flexible sigmoidoscopy) you may notice spotting of blood in your stool or on the toilet paper. If you underwent a bowel prep for your procedure, you may not have a normal bowel movement for a few days.  Please Note:  You might notice some irritation and congestion in your nose or some drainage.  This is from the oxygen used during your procedure.  There is no need for concern and it should clear up in a day or so.  SYMPTOMS TO REPORT IMMEDIATELY:   Following lower endoscopy (colonoscopy or flexible sigmoidoscopy):  Excessive amounts of blood in the stool  Significant tenderness or worsening of abdominal pains  Swelling of the abdomen that is new, acute  Fever of 100F or higher   For urgent or emergent issues, a gastroenterologist can be reached at any hour by calling [phone].   DIET: Your first meal following the procedure should be a small meal and then it is ok to progress to your normal diet. Heavy or fried foods are harder to digest and may make you feel nauseous or bloated.  Likewise, meals heavy in dairy and vegetables can increase bloating.  Drink plenty of fluids but you should avoid alcoholic beverages for 24 hours. Try to  increase the fiber and water in your diet.  ACTIVITY:  You should plan to take it easy for the rest of today and you should NOT DRIVE or use heavy machinery until tomorrow (because of the sedation medicines used during the test).    FOLLOW UP: Our staff will call the number listed on your records the next business day following your procedure to check on you and address any questions or concerns that you may have regarding the information given to you following your procedure. If we do not reach you, we will leave a message.  However, if you are feeling well and you are not experiencing any problems, there is no need to return our call.  We will assume that you have returned to your regular daily activities without incident.  If any biopsies were taken you will be contacted by phone or by letter within the next 1-3 weeks.  Please call us at [phone] if you have not heard about the biopsies in 3 weeks.    SIGNATURES/CONFIDENTIALITY: You and/or your care partner have signed paperwork which will be entered into your electronic medical record.  These signatures attest to the fact that that the information above on your After Visit Summary has been reviewed and is understood.  Full responsibility of the confidentiality of this discharge information lies with you and/or your care-partner.  Please, read the handouts given to you by your recovery room nurse.  Thank-you  for choosing [facility] for your healthcare needs today.

## 2016-06-16 ENCOUNTER — Telehealth: Payer: Self-pay

## 2016-06-16 NOTE — Telephone Encounter (Signed)
  Follow up Call-  Call back number 06/13/2016  Post procedure Call Back phone  # [phone]  Permission to leave phone message Yes     Patient questions:  Do you have a fever, pain , or abdominal swelling? No. Pain Score  0 *  Have you tolerated food without any problems? Yes.    Have you been able to return to your normal activities? Yes.    Do you have any questions about your discharge instructions: Diet   No. Medications  No. Follow up visit  No.  Do you have questions or concerns about your Care? No.  Actions: * If pain score is 4 or above: No action needed, pain <4.

## 2016-06-23 ENCOUNTER — Encounter: Payer: Self-pay | Admitting: Gastroenterology

## 2017-03-09 ENCOUNTER — Ambulatory Visit (INDEPENDENT_AMBULATORY_CARE_PROVIDER_SITE_OTHER): Payer: 59 | Admitting: Internal Medicine

## 2017-03-09 ENCOUNTER — Encounter: Payer: Self-pay | Admitting: Internal Medicine

## 2017-03-09 ENCOUNTER — Other Ambulatory Visit (INDEPENDENT_AMBULATORY_CARE_PROVIDER_SITE_OTHER): Payer: 59

## 2017-03-09 DIAGNOSIS — Z0000 Encounter for general adult medical examination without abnormal findings: Secondary | ICD-10-CM | POA: Diagnosis not present

## 2017-03-09 LAB — CBC WITH DIFFERENTIAL/PLATELET
Basophils Absolute: 0.1 10*3/uL (ref 0.0–0.1)
Basophils Relative: 1 % (ref 0.0–3.0)
Eosinophils Absolute: 0.4 10*3/uL (ref 0.0–0.7)
Eosinophils Relative: 6.3 % — ABNORMAL HIGH (ref 0.0–5.0)
HEMATOCRIT: 44.7 % (ref 39.0–52.0)
HEMOGLOBIN: 15.2 g/dL (ref 13.0–17.0)
LYMPHS PCT: 22 % (ref 12.0–46.0)
Lymphs Abs: 1.3 10*3/uL (ref 0.7–4.0)
MCHC: 33.9 g/dL (ref 30.0–36.0)
MCV: 92.6 fl (ref 78.0–100.0)
MONOS PCT: 8.6 % (ref 3.0–12.0)
Monocytes Absolute: 0.5 10*3/uL (ref 0.1–1.0)
Neutro Abs: 3.8 10*3/uL (ref 1.4–7.7)
Neutrophils Relative %: 62.1 % (ref 43.0–77.0)
Platelets: 280 10*3/uL (ref 150.0–400.0)
RBC: 4.82 Mil/uL (ref 4.22–5.81)
RDW: 13.3 % (ref 11.5–15.5)
WBC: 6.1 10*3/uL (ref 4.0–10.5)

## 2017-03-09 LAB — HEPATIC FUNCTION PANEL
ALBUMIN: 4.5 g/dL (ref 3.5–5.2)
ALT: 17 U/L (ref 0–53)
AST: 16 U/L (ref 0–37)
Alkaline Phosphatase: 73 U/L (ref 39–117)
BILIRUBIN TOTAL: 0.7 mg/dL (ref 0.2–1.2)
Bilirubin, Direct: 0.1 mg/dL (ref 0.0–0.3)
TOTAL PROTEIN: 6.9 g/dL (ref 6.0–8.3)

## 2017-03-09 LAB — URINALYSIS
BILIRUBIN URINE: NEGATIVE
HGB URINE DIPSTICK: NEGATIVE
KETONES UR: NEGATIVE
LEUKOCYTES UA: NEGATIVE
NITRITE: NEGATIVE
Specific Gravity, Urine: 1.01 (ref 1.000–1.030)
Total Protein, Urine: NEGATIVE
UROBILINOGEN UA: 0.2 (ref 0.0–1.0)
Urine Glucose: NEGATIVE
pH: 6 (ref 5.0–8.0)

## 2017-03-09 LAB — LIPID PANEL
CHOLESTEROL: 202 mg/dL — AB (ref 0–200)
HDL: 37.8 mg/dL — ABNORMAL LOW (ref >39.00)
LDL Cholesterol: 136 mg/dL — ABNORMAL HIGH (ref 0–99)
NonHDL: 164.47
TRIGLYCERIDES: 140 mg/dL (ref 0.0–149.0)
Total CHOL/HDL Ratio: 5
VLDL: 28 mg/dL (ref 0.0–40.0)

## 2017-03-09 LAB — PSA: PSA: 0.85 ng/mL (ref 0.10–4.00)

## 2017-03-09 LAB — TSH: TSH: 1.68 u[IU]/mL (ref 0.35–4.50)

## 2017-03-09 NOTE — Progress Notes (Signed)
Subjective:  Patient ID: Justin Wong, male    DOB: 1961/11/12  Age: 56 y.o. MRN: [id number]  CC: No chief complaint on file.   HPI Justin Wong presents for a well exam  Outpatient Medications Prior to Visit  Medication Sig Dispense Refill  . ADVAIR DISKUS 100-50 MCG/DOSE AEPB INHALE 1 PUFF INTO THE LUNGS 2 (TWO) TIMES DAILY. 60 each 5  . Cetirizine HCl (ZYRTEC ALLERGY PO) Take by mouth daily.    . fluticasone (FLONASE) 50 MCG/ACT nasal spray Place 2 sprays into both nostrils daily. Reported on 06/02/2016    . Multiple Vitamins-Minerals (MENS 50+ MULTI VITAMIN/MIN) TABS Take 1 tablet by mouth daily.    . VENTOLIN HFA 108 (90 Base) MCG/ACT inhaler INHALE 2 PUFFS INTO THE LUNGS EVERY 6 (SIX) HOURS AS NEEDED FOR WHEEZING. 18 Inhaler 5   No facility-administered medications prior to visit.     ROS Review of Systems  Constitutional: Negative for appetite change, fatigue and unexpected weight change.  HENT: Negative for congestion, nosebleeds, sneezing, sore throat and trouble swallowing.   Eyes: Negative for itching and visual disturbance.  Respiratory: Negative for cough.   Cardiovascular: Negative for chest pain, palpitations and leg swelling.  Gastrointestinal: Negative for abdominal distention, blood in stool, diarrhea and nausea.  Genitourinary: Negative for frequency and hematuria.  Musculoskeletal: Negative for back pain, gait problem, joint swelling and neck pain.  Skin: Negative for rash.  Neurological: Negative for dizziness, tremors, speech difficulty and weakness.  Psychiatric/Behavioral: Negative for agitation, dysphoric mood, sleep disturbance and suicidal ideas. The patient is not nervous/anxious.     Objective:  BP 122/74 (BP Location: Right Arm, Patient Position: Sitting, Cuff Size: Large)   Pulse 74   Temp 98 F (36.7 C) (Oral)   Ht 5\' 5"  (1.651 m)   Wt 197 lb 1.3 oz (89.4 kg)   SpO2 98%   BMI 32.80 kg/m   BP Readings from Last 3 Encounters:    03/09/17 122/74  06/13/16 119/78  03/06/16 132/80    Wt Readings from Last 3 Encounters:  03/09/17 197 lb 1.3 oz (89.4 kg)  06/13/16 193 lb (87.5 kg)  06/02/16 193 lb (87.5 kg)    Physical Exam  Constitutional: He is oriented to person, place, and time. He appears well-developed. No distress.  NAD  HENT:  Mouth/Throat: Oropharynx is clear and moist.  Eyes: Conjunctivae are normal. Pupils are equal, round, and reactive to light.  Neck: Normal range of motion. No JVD present. No thyromegaly present.  Cardiovascular: Normal rate, regular rhythm, normal heart sounds and intact distal pulses.  Exam reveals no gallop and no friction rub.   No murmur heard. Pulmonary/Chest: Effort normal and breath sounds normal. No respiratory distress. He has no wheezes. He has no rales. He exhibits no tenderness.  Abdominal: Soft. Bowel sounds are normal. He exhibits no distension and no mass. There is no tenderness. There is no rebound and no guarding.  Genitourinary: Rectum normal and penis normal. Rectal exam shows guaiac negative stool.  Musculoskeletal: Normal range of motion. He exhibits no edema or tenderness.  Lymphadenopathy:    He has no cervical adenopathy.  Neurological: He is alert and oriented to person, place, and time. He has normal reflexes. No cranial nerve deficit. He exhibits normal muscle tone. He displays a negative Romberg sign. Coordination and gait normal.  Skin: Skin is warm and dry. No rash noted.  Psychiatric: He has a normal mood and affect. His behavior is normal. Judgment  and thought content normal.  prostate 1+  Lab Results  Component Value Date   WBC 6.2 03/06/2016   HGB 14.7 03/06/2016   HCT 42.9 03/06/2016   PLT 284.0 03/06/2016   GLUCOSE 94 03/06/2016   CHOL 201 (H) 03/06/2016   TRIG 144.0 03/06/2016   HDL 42.00 03/06/2016   LDLDIRECT 150.1 02/17/2012   LDLCALC 130 (H) 03/06/2016   ALT 17 03/06/2016   AST 17 03/06/2016   NA 138 03/06/2016   K 5.3 (H)  03/06/2016   CL 105 03/06/2016   CREATININE 1.09 03/06/2016   BUN 19 03/06/2016   CO2 27 03/06/2016   TSH 2.03 03/06/2016   PSA 0.82 03/06/2016   HGBA1C 5.8 03/06/2015    Dg Eye Foreign Body  Result Date: 05/11/2013 *RADIOLOGY REPORT* Clinical Data: Metal exposure.  Pre MRI. ORBITS FOR FOREIGN BODY - 2 VIEW Comparison: None. Findings: No radiopaque foreign body is evident.  The visualized paranasal sinuses are clear. IMPRESSION: No radiopaque foreign body projects over either orbit. Original Report Authenticated By: San Morelle, M.D.   Mr Shoulder Right W Contrast  Result Date: 05/12/2013 *RADIOLOGY REPORT* Clinical Data: Right shoulder pain over the past 3 months. MRI ARTHROGRAM OF THE RIGHT SHOULDER WITH INTRA-ARTICULAR CONTRAST Technique:  Multiplanar, multisequence MR imaging of the right shoulder was performed following injection of dilute gadolinium contrast medium into the right glenohumeral joint. Comparison:  05/02/2013 radiographs FINDINGS: Despite efforts by the patient and technologist, motion artifact is present on some series of today's examination and could not be totally eliminated.  This reduces diagnostic sensitivity and specificity.  Rotator cuff:  Full-thickness partial-width tear of the distal supraspinatus tendon extends obliquely on image 8 of series 5. Partial thickness articular surface tearing of the infraspinatus is present.  Partial thickness articular surface tearing of the subscapularis is present. Muscles:  Intact Biceps long head:  Tendinopathy of the intra-articular segment. Acromioclavicular Joint:  Moderate degenerative AC joint arthropathy noted. The acromial undersurface is type 2 (curved). Glenohumeral Joint:  Intact Labrum:  Abnormal increased T1 signal in the superior labrum is compatible with mildly displaced SLAP tear. Bones:  AC joint is noted above; otherwise intact. IMPRESSION: 1.  Full-thickness partial-width tear of the distal supraspinatus  tendon.  Partial thickness articular surface tearing of the infraspinatus and subscapularis. 2.  Mildly displaced SLAP tear of the glenoid labrum. 3.  Tendinopathy of the intra-articular segment the long head of the biceps. 4.  Moderate degenerative AC joint arthropathy. Original Report Authenticated By: Van Clines, M.D.   Dg Fluoro Guide Ndl Plc/bx  Result Date: 05/11/2013 *RADIOLOGY REPORT* Clinical data: Rotator cuff pathology RIGHT SHOULDER INJECTION UNDER FLUOROSCOPY FOR MRI Technique: The procedure, risks (including but not limited to bleeding, infection, organ damage), benefits, and alternatives were explained to the patient.  Questions regarding the procedure were encouraged and answered.  The patient understands and consents to the procedure. An appropriate skin entry site was determined under fluoroscopy. Skin site was marked, prepped with Betadine, and draped in usual sterile fashion, and infiltrated locally with 1% lidocaine. 22-gauge spinal needle advanced to the superior medial margin of the humeral head. 1 mL of lidocaine 1% injected easily. 71ml of a mixture of 68ml lidocaine 1%, 39ml saline, and 55ml Omnipaque 180 with 0.82ml Multihance contrast was injected into the shoulder joint. Intraarticular flow was confirmed on fluoroscopy. Patient transferred to MRI. Fluoroscopy time:  11seconds IMPRESSION 1. Technically successful right shoulder injection for MRI Original Report Authenticated By: D. Wallace Going, MD  Assessment & Plan:   There are no diagnoses linked to this encounter. I am having Mr. Kerrigan maintain his Cetirizine HCl (ZYRTEC ALLERGY PO), MENS 50+ MULTI VITAMIN/MIN, fluticasone, ADVAIR DISKUS, and VENTOLIN HFA.  No orders of the defined types were placed in this encounter.    Follow-up: No Follow-up on file.  Walker Kehr, MD

## 2017-03-09 NOTE — Progress Notes (Signed)
Pre visit review using our clinic review tool, if applicable. No additional management support is needed unless otherwise documented below in the visit note. 

## 2017-03-09 NOTE — Assessment & Plan Note (Signed)
We discussed age appropriate health related issues, including available/recomended screening tests and vaccinations. We discussed a need for adhering to healthy diet and exercise. Labs/EKG were reviewed/ordered. All questions were answered.   

## 2017-03-09 NOTE — Patient Instructions (Signed)
Shingrix vaccine

## 2017-03-29 ENCOUNTER — Other Ambulatory Visit: Payer: Self-pay | Admitting: Internal Medicine

## 2017-10-13 ENCOUNTER — Other Ambulatory Visit: Payer: Self-pay | Admitting: Internal Medicine

## 2017-12-09 DIAGNOSIS — J019 Acute sinusitis, unspecified: Secondary | ICD-10-CM | POA: Diagnosis not present

## 2018-03-11 ENCOUNTER — Encounter: Payer: 59 | Admitting: Internal Medicine

## 2018-03-17 DIAGNOSIS — M7542 Impingement syndrome of left shoulder: Secondary | ICD-10-CM | POA: Diagnosis not present

## 2018-03-23 ENCOUNTER — Telehealth: Payer: Self-pay | Admitting: Internal Medicine

## 2018-03-23 NOTE — Telephone Encounter (Signed)
Rec'd from [facility] forwarded 2 pages to Dr. Lew Dawes

## 2018-04-01 ENCOUNTER — Encounter: Payer: 59 | Admitting: Internal Medicine

## 2018-04-01 DIAGNOSIS — Z0289 Encounter for other administrative examinations: Secondary | ICD-10-CM

## 2018-04-27 DIAGNOSIS — M25511 Pain in right shoulder: Secondary | ICD-10-CM | POA: Diagnosis not present

## 2018-04-28 DIAGNOSIS — M7542 Impingement syndrome of left shoulder: Secondary | ICD-10-CM | POA: Diagnosis not present

## 2018-06-10 DIAGNOSIS — G8918 Other acute postprocedural pain: Secondary | ICD-10-CM | POA: Diagnosis not present

## 2018-06-10 DIAGNOSIS — S43432A Superior glenoid labrum lesion of left shoulder, initial encounter: Secondary | ICD-10-CM | POA: Diagnosis not present

## 2018-06-10 DIAGNOSIS — M75112 Incomplete rotator cuff tear or rupture of left shoulder, not specified as traumatic: Secondary | ICD-10-CM | POA: Diagnosis not present

## 2018-06-10 DIAGNOSIS — M19012 Primary osteoarthritis, left shoulder: Secondary | ICD-10-CM | POA: Diagnosis not present

## 2018-06-10 DIAGNOSIS — M7542 Impingement syndrome of left shoulder: Secondary | ICD-10-CM | POA: Diagnosis not present

## 2018-06-10 DIAGNOSIS — S46012A Strain of muscle(s) and tendon(s) of the rotator cuff of left shoulder, initial encounter: Secondary | ICD-10-CM | POA: Diagnosis not present

## 2018-06-10 DIAGNOSIS — M25512 Pain in left shoulder: Secondary | ICD-10-CM | POA: Diagnosis not present

## 2018-06-10 DIAGNOSIS — M24112 Other articular cartilage disorders, left shoulder: Secondary | ICD-10-CM | POA: Diagnosis not present

## 2018-06-23 DIAGNOSIS — S46012D Strain of muscle(s) and tendon(s) of the rotator cuff of left shoulder, subsequent encounter: Secondary | ICD-10-CM | POA: Diagnosis not present

## 2018-06-25 ENCOUNTER — Other Ambulatory Visit: Payer: Self-pay

## 2018-06-25 ENCOUNTER — Other Ambulatory Visit (HOSPITAL_COMMUNITY): Payer: Self-pay | Admitting: Orthopedic Surgery

## 2018-06-25 ENCOUNTER — Inpatient Hospital Stay (HOSPITAL_COMMUNITY): Admission: RE | Admit: 2018-06-25 | Payer: 59 | Source: Ambulatory Visit

## 2018-06-25 DIAGNOSIS — R600 Localized edema: Secondary | ICD-10-CM

## 2018-06-28 ENCOUNTER — Ambulatory Visit (HOSPITAL_COMMUNITY)
Admission: RE | Admit: 2018-06-28 | Discharge: 2018-06-28 | Disposition: A | Discharge status: Home / Self Care | Payer: 59 | Source: Ambulatory Visit | Attending: Orthopedic Surgery | Admitting: Orthopedic Surgery

## 2018-06-28 DIAGNOSIS — R600 Localized edema: Secondary | ICD-10-CM | POA: Diagnosis not present

## 2018-07-26 DIAGNOSIS — M25612 Stiffness of left shoulder, not elsewhere classified: Secondary | ICD-10-CM | POA: Diagnosis not present

## 2018-07-26 DIAGNOSIS — M6281 Muscle weakness (generalized): Secondary | ICD-10-CM | POA: Diagnosis not present

## 2018-07-26 DIAGNOSIS — M25512 Pain in left shoulder: Secondary | ICD-10-CM | POA: Diagnosis not present

## 2018-07-29 DIAGNOSIS — M25612 Stiffness of left shoulder, not elsewhere classified: Secondary | ICD-10-CM | POA: Diagnosis not present

## 2018-07-29 DIAGNOSIS — M25512 Pain in left shoulder: Secondary | ICD-10-CM | POA: Diagnosis not present

## 2018-07-29 DIAGNOSIS — M6281 Muscle weakness (generalized): Secondary | ICD-10-CM | POA: Diagnosis not present

## 2018-08-03 DIAGNOSIS — M25612 Stiffness of left shoulder, not elsewhere classified: Secondary | ICD-10-CM | POA: Diagnosis not present

## 2018-08-03 DIAGNOSIS — M25512 Pain in left shoulder: Secondary | ICD-10-CM | POA: Diagnosis not present

## 2018-08-03 DIAGNOSIS — M6281 Muscle weakness (generalized): Secondary | ICD-10-CM | POA: Diagnosis not present

## 2018-08-05 DIAGNOSIS — M25612 Stiffness of left shoulder, not elsewhere classified: Secondary | ICD-10-CM | POA: Diagnosis not present

## 2018-08-05 DIAGNOSIS — M6281 Muscle weakness (generalized): Secondary | ICD-10-CM | POA: Diagnosis not present

## 2018-08-05 DIAGNOSIS — M25512 Pain in left shoulder: Secondary | ICD-10-CM | POA: Diagnosis not present

## 2018-08-09 DIAGNOSIS — M6281 Muscle weakness (generalized): Secondary | ICD-10-CM | POA: Diagnosis not present

## 2018-08-09 DIAGNOSIS — M25512 Pain in left shoulder: Secondary | ICD-10-CM | POA: Diagnosis not present

## 2018-08-09 DIAGNOSIS — M25612 Stiffness of left shoulder, not elsewhere classified: Secondary | ICD-10-CM | POA: Diagnosis not present

## 2018-08-12 DIAGNOSIS — M25512 Pain in left shoulder: Secondary | ICD-10-CM | POA: Diagnosis not present

## 2018-08-12 DIAGNOSIS — M6281 Muscle weakness (generalized): Secondary | ICD-10-CM | POA: Diagnosis not present

## 2018-08-12 DIAGNOSIS — M25612 Stiffness of left shoulder, not elsewhere classified: Secondary | ICD-10-CM | POA: Diagnosis not present

## 2018-08-16 DIAGNOSIS — M25612 Stiffness of left shoulder, not elsewhere classified: Secondary | ICD-10-CM | POA: Diagnosis not present

## 2018-08-16 DIAGNOSIS — M25512 Pain in left shoulder: Secondary | ICD-10-CM | POA: Diagnosis not present

## 2018-08-16 DIAGNOSIS — M6281 Muscle weakness (generalized): Secondary | ICD-10-CM | POA: Diagnosis not present

## 2018-08-19 DIAGNOSIS — M25512 Pain in left shoulder: Secondary | ICD-10-CM | POA: Diagnosis not present

## 2018-08-19 DIAGNOSIS — M6281 Muscle weakness (generalized): Secondary | ICD-10-CM | POA: Diagnosis not present

## 2018-08-19 DIAGNOSIS — M25612 Stiffness of left shoulder, not elsewhere classified: Secondary | ICD-10-CM | POA: Diagnosis not present

## 2018-08-23 DIAGNOSIS — M25512 Pain in left shoulder: Secondary | ICD-10-CM | POA: Diagnosis not present

## 2018-08-23 DIAGNOSIS — M6281 Muscle weakness (generalized): Secondary | ICD-10-CM | POA: Diagnosis not present

## 2018-08-23 DIAGNOSIS — M25612 Stiffness of left shoulder, not elsewhere classified: Secondary | ICD-10-CM | POA: Diagnosis not present

## 2018-08-24 ENCOUNTER — Other Ambulatory Visit: Payer: Self-pay | Admitting: Internal Medicine

## 2018-08-26 DIAGNOSIS — M6281 Muscle weakness (generalized): Secondary | ICD-10-CM | POA: Diagnosis not present

## 2018-08-26 DIAGNOSIS — M25612 Stiffness of left shoulder, not elsewhere classified: Secondary | ICD-10-CM | POA: Diagnosis not present

## 2018-08-26 DIAGNOSIS — M25512 Pain in left shoulder: Secondary | ICD-10-CM | POA: Diagnosis not present

## 2018-08-30 DIAGNOSIS — M6281 Muscle weakness (generalized): Secondary | ICD-10-CM | POA: Diagnosis not present

## 2018-08-30 DIAGNOSIS — M25612 Stiffness of left shoulder, not elsewhere classified: Secondary | ICD-10-CM | POA: Diagnosis not present

## 2018-08-30 DIAGNOSIS — M25512 Pain in left shoulder: Secondary | ICD-10-CM | POA: Diagnosis not present

## 2018-09-02 DIAGNOSIS — M6281 Muscle weakness (generalized): Secondary | ICD-10-CM | POA: Diagnosis not present

## 2018-09-02 DIAGNOSIS — M25512 Pain in left shoulder: Secondary | ICD-10-CM | POA: Diagnosis not present

## 2018-09-02 DIAGNOSIS — M25612 Stiffness of left shoulder, not elsewhere classified: Secondary | ICD-10-CM | POA: Diagnosis not present

## 2018-09-07 DIAGNOSIS — M25612 Stiffness of left shoulder, not elsewhere classified: Secondary | ICD-10-CM | POA: Diagnosis not present

## 2018-09-07 DIAGNOSIS — M6281 Muscle weakness (generalized): Secondary | ICD-10-CM | POA: Diagnosis not present

## 2018-09-07 DIAGNOSIS — M25512 Pain in left shoulder: Secondary | ICD-10-CM | POA: Diagnosis not present

## 2018-09-14 DIAGNOSIS — M25612 Stiffness of left shoulder, not elsewhere classified: Secondary | ICD-10-CM | POA: Diagnosis not present

## 2018-09-14 DIAGNOSIS — M25512 Pain in left shoulder: Secondary | ICD-10-CM | POA: Diagnosis not present

## 2018-09-14 DIAGNOSIS — M6281 Muscle weakness (generalized): Secondary | ICD-10-CM | POA: Diagnosis not present

## 2018-09-16 DIAGNOSIS — M6281 Muscle weakness (generalized): Secondary | ICD-10-CM | POA: Diagnosis not present

## 2018-09-16 DIAGNOSIS — M25612 Stiffness of left shoulder, not elsewhere classified: Secondary | ICD-10-CM | POA: Diagnosis not present

## 2018-09-16 DIAGNOSIS — M25512 Pain in left shoulder: Secondary | ICD-10-CM | POA: Diagnosis not present

## 2018-09-27 DIAGNOSIS — M25612 Stiffness of left shoulder, not elsewhere classified: Secondary | ICD-10-CM | POA: Diagnosis not present

## 2018-09-27 DIAGNOSIS — M25512 Pain in left shoulder: Secondary | ICD-10-CM | POA: Diagnosis not present

## 2018-09-27 DIAGNOSIS — M6281 Muscle weakness (generalized): Secondary | ICD-10-CM | POA: Diagnosis not present

## 2018-09-29 DIAGNOSIS — M6281 Muscle weakness (generalized): Secondary | ICD-10-CM | POA: Diagnosis not present

## 2018-10-05 DIAGNOSIS — M25561 Pain in right knee: Secondary | ICD-10-CM | POA: Diagnosis not present

## 2018-10-13 DIAGNOSIS — M25561 Pain in right knee: Secondary | ICD-10-CM | POA: Diagnosis not present

## 2018-10-16 ENCOUNTER — Emergency Department (HOSPITAL_BASED_OUTPATIENT_CLINIC_OR_DEPARTMENT_OTHER): Payer: 59

## 2018-10-16 ENCOUNTER — Encounter (HOSPITAL_BASED_OUTPATIENT_CLINIC_OR_DEPARTMENT_OTHER): Payer: Self-pay | Admitting: Emergency Medicine

## 2018-10-16 ENCOUNTER — Emergency Department (HOSPITAL_BASED_OUTPATIENT_CLINIC_OR_DEPARTMENT_OTHER)
Admission: EM | Admit: 2018-10-16 | Discharge: 2018-10-16 | Disposition: A | Discharge status: Home / Self Care | Payer: 59 | Attending: Emergency Medicine | Admitting: Emergency Medicine

## 2018-10-16 ENCOUNTER — Other Ambulatory Visit: Payer: Self-pay

## 2018-10-16 DIAGNOSIS — R2241 Localized swelling, mass and lump, right lower limb: Secondary | ICD-10-CM | POA: Diagnosis not present

## 2018-10-16 DIAGNOSIS — J45909 Unspecified asthma, uncomplicated: Secondary | ICD-10-CM | POA: Diagnosis not present

## 2018-10-16 DIAGNOSIS — Z79899 Other long term (current) drug therapy: Secondary | ICD-10-CM | POA: Diagnosis not present

## 2018-10-16 DIAGNOSIS — M79604 Pain in right leg: Secondary | ICD-10-CM | POA: Diagnosis not present

## 2018-10-16 DIAGNOSIS — I824Z1 Acute embolism and thrombosis of unspecified deep veins of right distal lower extremity: Secondary | ICD-10-CM | POA: Diagnosis not present

## 2018-10-16 NOTE — ED Notes (Signed)
Pt d/c home with family. Home care and pain management discussed

## 2018-10-16 NOTE — ED Provider Notes (Signed)
[facility] EMERGENCY DEPARTMENT Provider Note   CSN: [id number] Arrival date & time: 10/16/18  1845     History   Chief Complaint Chief Complaint  Patient presents with  . Leg Pain    HPI Justin Wong is a 57 y.o. male.  57 y.o male with a PMH of Asthma presents to the ED with a chief complaint of right calf swelling x 3 days. Patient Justin Wong on Wednesday and had an MRI of his right knee which showed no acute abnormality.  He was given a cortisone shot along with a steroid taper to treat suspected IT band irritation.  Reports since leaving his office his right calf has significantly swelled. He was referred to the ED to r/o a DVT. Patient denies any previous history of DVT, reports after having his AC paired he had some swelling on his legs and had an ultrasound done which was negative.  Reports his right knee is still in pain after treatment with the steroids and the cortisone injection.  Eyes any chest pain, shortness of breath, trauma or other complaints.     Past Medical History:  Diagnosis Date  . Asthma   . Colon polyps 02/21/2013   2013 Dr Fuller Plan Due colon 2016  . Environmental allergies     Patient Active Problem List   Diagnosis Date Noted  . Neoplasm of uncertain behavior of skin 03/06/2015  . Elevated BP 03/06/2015  . Pain in joint, shoulder region 02/24/2014  . Colon polyps 02/21/2013  . Cough 05/25/2012  . Acute bronchitis 05/25/2012  . Asthma with acute exacerbation 05/25/2012  . Well adult exam 02/17/2012    Past Surgical History:  Procedure Laterality Date  . GANGLION CYST EXCISION  2000   left wrist  . SHOULDER ARTHROSCOPY WITH SUBACROMIAL DECOMPRESSION, ROTATOR CUFF REPAIR AND BICEP TENDON REPAIR Right         Home Medications    Prior to Admission medications   Medication Sig Start Date End Date Taking? Authorizing Provider  Cetirizine HCl (ZYRTEC ALLERGY PO) Take by mouth daily.    [provider]    fluticasone (FLONASE) 50 MCG/ACT nasal spray Place 2 sprays into both nostrils daily. Reported on 06/02/2016    [provider]  Fluticasone-Salmeterol (ADVAIR DISKUS) 100-50 MCG/DOSE AEPB Inhale 1 puff into the lungs 2 (two) times daily. Patient needs office visit before refills will be given 08/25/18   [name], [name], MD  Multiple Vitamins-Minerals (MENS 50+ MULTI VITAMIN/MIN) TABS Take 1 tablet by mouth daily.    [provider]  VENTOLIN HFA 108 (90 Base) MCG/ACT inhaler INHALE 2 PUFFS INTO THE LUNGS EVERY 6 (SIX) HOURS AS NEEDED FOR WHEEZING. 05/02/16   [name], [name], MD    Family History Family History  Problem Relation Age of Onset  . Colon cancer Paternal Uncle   . Colon cancer Father        21's  . Colon cancer Paternal Grandfather   . Stomach cancer Neg Hx   . Esophageal cancer Neg Hx   . Rectal cancer Neg Hx     Social History Social History   Tobacco Use  . Smoking status: Never Smoker  . Smokeless tobacco: Never Used  Substance Use Topics  . Alcohol use: Yes    Alcohol/week: 1.0 standard drinks    Types: 1 Shots of liquor per week    Comment: 7/wk  . Drug use: No     Allergies   Patient has no known  allergies.   Review of Systems Review of Systems  Constitutional: Negative for chills and fever.  Respiratory: Negative for shortness of breath.   Cardiovascular: Positive for leg swelling. Negative for chest pain.  Gastrointestinal: Negative for abdominal pain, diarrhea and vomiting.  Genitourinary: Negative for dysuria and flank pain.  Musculoskeletal: Positive for myalgias.  Neurological: Negative for headaches.     Physical Exam Updated Vital Signs BP (!) 148/88 (BP Location: Left Arm)   Pulse 73   Temp 98.9 F (37.2 C) (Oral)   Resp 18   Ht 5\' 5"  (1.651 m)   Wt 86.2 kg   SpO2 100%   BMI 31.62 kg/m   Physical Exam  Constitutional: He is oriented to person, place, and time. He appears well-developed and  well-nourished.  HENT:  Head: Normocephalic and atraumatic.  Mouth/Throat: Oropharynx is clear and moist.  Eyes: Pupils are equal, round, and reactive to light. No scleral icterus.  Neck: Normal range of motion.  Cardiovascular: Normal heart sounds.  Pulmonary/Chest: Effort normal and breath sounds normal. He has no wheezes. He exhibits no tenderness.  Abdominal: Soft. Bowel sounds are normal. He exhibits no distension. There is no tenderness.  Musculoskeletal: He exhibits no deformity.       Right lower leg: He exhibits tenderness and swelling. He exhibits no bony tenderness, no edema, no deformity and no laceration.       Legs: Neurological: He is alert and oriented to person, place, and time.  Skin: Skin is warm and dry.  Nursing note and vitals reviewed.    ED Treatments / Results  Labs (all labs ordered are listed, but only abnormal results are displayed) Labs Reviewed - No data to display  EKG None  Radiology US Venous Img Lower Right (dvt Study)  Result Date: 10/16/2018 CLINICAL DATA:  Calf swelling EXAM: RIGHT LOWER EXTREMITY VENOUS DOPPLER ULTRASOUND TECHNIQUE: Gray-scale sonography with graded compression, as well as color Doppler and duplex ultrasound were performed to evaluate the lower extremity deep venous systems from the level of the common femoral vein and including the common femoral, femoral, profunda femoral, popliteal and calf veins including the posterior tibial, peroneal and gastrocnemius veins when visible. The superficial great saphenous vein was also interrogated. Spectral Doppler was utilized to evaluate flow at rest and with distal augmentation maneuvers in the common femoral, femoral and popliteal veins. COMPARISON:  None. FINDINGS: Contralateral Common Femoral Vein: Respiratory phasicity is normal and symmetric with the symptomatic side. No evidence of thrombus. Normal compressibility. Common Femoral Vein: No evidence of thrombus. Normal compressibility,  respiratory phasicity and response to augmentation. Saphenofemoral Junction: No evidence of thrombus. Normal compressibility and flow on color Doppler imaging. Profunda Femoral Vein: No evidence of thrombus. Normal compressibility and flow on color Doppler imaging. Femoral Vein: No evidence of thrombus. Normal compressibility, respiratory phasicity and response to augmentation. Popliteal Vein: No evidence of thrombus. Normal compressibility, respiratory phasicity and response to augmentation. Calf Veins: No evidence of thrombus. Normal compressibility and flow on color Doppler imaging. Superficial Great Saphenous Vein: No evidence of thrombus. Normal compressibility. Venous Reflux:  None. Other Findings: There appears to be a thrombosed vein within a muscle of the medial left calf. IMPRESSION: No evidence of deep venous thrombosis. Thrombosis of a small muscular venous branch in the medial left calf. Electronically Signed   By: Rolm Baptise M.D.   On: 10/16/2018 20:35    Procedures Procedures (including critical care time)  Medications Ordered in ED Medications - No data to display  Initial Impression / Assessment and Plan / ED Course  I have reviewed the triage vital signs and the nursing notes.  Pertinent labs & imaging results that were available during my care of the patient were reviewed by me and considered in my medical decision making (see chart for details).   Presents with right leg swelling, he was seen by Justin Wong it with steroids along with cortisone injection but reports no relieving symptoms.  Reports swelling to his right calf along with pain therefore was sent in to rule out DVT.  Ultrasound lower extremity showed no DVT, it did show  Thrombosis of a small muscular venous branch in the medial left  calf.     Patient for a superficial phlebitis as patient had a fall prior to seeing his orthopedist.  Is advised to take some ibuprofen over-the-counter for his pain, he does  have an appointment to follow-up with his orthopedist on Wednesday.  He does also have a prescription for Percocet at home which she does not like taking pain medication.  Return precautions provided   Final Clinical Impressions(s) / ED Diagnoses   Final diagnoses:  Right leg pain    ED Discharge Orders    None       Janeece Fitting, PA-C 10/16/18 2118    Lennice Sites, DO 10/16/18 2358

## 2018-10-16 NOTE — ED Notes (Signed)
Patient in Ultrasound.

## 2018-10-16 NOTE — Discharge Instructions (Addendum)
Your ultrasound today was negative. Please follow up with your orthopedist as needed. You may also take ibuprofen for the pain.If you experience any worsening symptoms or shortness of breath you may return to the ED for reevaluation.

## 2018-10-16 NOTE — ED Triage Notes (Signed)
Patient states that he irritated his IT band about 2 weeks ago - he had an MRI and other testing with Joellyn Haff - the patient now has pain and swelling to his right knee and calf region and was sent here for possible DVT. Denies any chest pain or SOB

## 2018-10-20 DIAGNOSIS — M25561 Pain in right knee: Secondary | ICD-10-CM | POA: Diagnosis not present

## 2018-11-03 ENCOUNTER — Other Ambulatory Visit: Payer: Self-pay | Admitting: Orthopedic Surgery

## 2018-11-03 DIAGNOSIS — M25561 Pain in right knee: Secondary | ICD-10-CM

## 2018-11-07 ENCOUNTER — Ambulatory Visit
Admission: RE | Admit: 2018-11-07 | Discharge: 2018-11-07 | Disposition: A | Discharge status: Home / Self Care | Payer: 59 | Source: Ambulatory Visit | Attending: Orthopedic Surgery | Admitting: Orthopedic Surgery

## 2018-11-07 DIAGNOSIS — M25561 Pain in right knee: Secondary | ICD-10-CM

## 2018-11-07 DIAGNOSIS — S83281A Other tear of lateral meniscus, current injury, right knee, initial encounter: Secondary | ICD-10-CM | POA: Diagnosis not present

## 2018-11-10 DIAGNOSIS — S83281A Other tear of lateral meniscus, current injury, right knee, initial encounter: Secondary | ICD-10-CM | POA: Diagnosis not present

## 2018-11-13 ENCOUNTER — Other Ambulatory Visit: Payer: 59

## 2018-11-15 DIAGNOSIS — S83251A Bucket-handle tear of lateral meniscus, current injury, right knee, initial encounter: Secondary | ICD-10-CM | POA: Diagnosis not present

## 2018-11-15 DIAGNOSIS — S83281A Other tear of lateral meniscus, current injury, right knee, initial encounter: Secondary | ICD-10-CM | POA: Diagnosis not present

## 2018-11-15 DIAGNOSIS — M948X6 Other specified disorders of cartilage, lower leg: Secondary | ICD-10-CM | POA: Diagnosis not present

## 2018-11-16 DIAGNOSIS — S83281D Other tear of lateral meniscus, current injury, right knee, subsequent encounter: Secondary | ICD-10-CM | POA: Diagnosis not present

## 2018-12-01 HISTORY — PX: MENISCUS REPAIR: SHX5179

## 2019-02-10 IMAGING — MR MR KNEE*R* W/O CM
8 series · 40 of 40 positions shown · non-contrast
Comparison: None.

CLINICAL DATA: 7 weeks ago pt was squatting and stood up when he
felt a pop in his right knee. Pain and swelling in right knee since.
Pain w/ flexion and extension.

EXAM:
MRI OF THE RIGHT KNEE WITHOUT CONTRAST
TECHNIQUE: Multiplanar, multisequence MR imaging of the knee was performed. No
intravenous contrast was administered.

[Series 3: T2 fat-sat · axial · 4.0mm · 0.62mm/px · z∈[-73,+44]mm · 4 of 25 slices shown (1 of 3)]
[im 1/25]
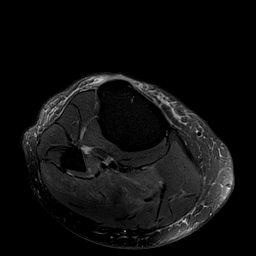
[im 9/25]
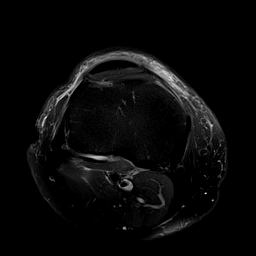
[im 17/25]
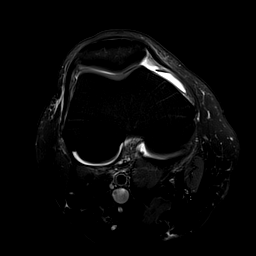
[im 25/25]
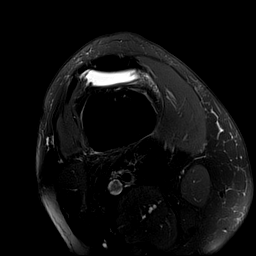

[Series 4: T1 · coronal · 4.0mm · 0.47mm/px · 4 of 24 slices shown]
[im 1/24]
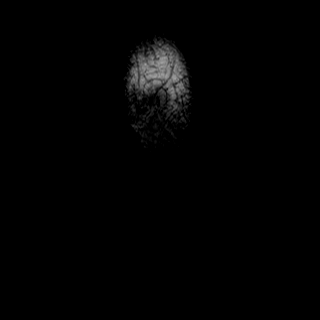
[im 8/24]
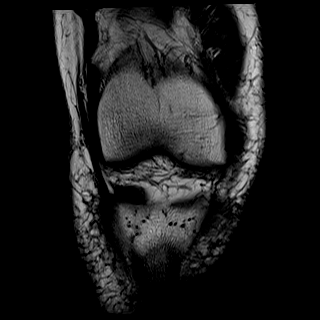
[im 16/24]
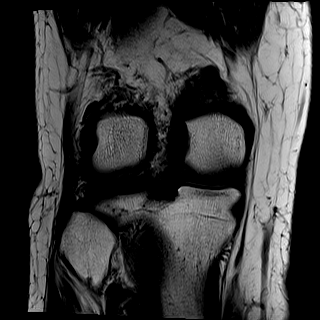
[im 24/24]
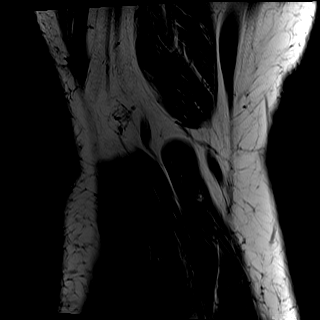

[Series 5: T2 fat-sat · coronal · 4.0mm · 0.59mm/px · 5 of 24 slices shown (2 of 3)]
[im 1/24]
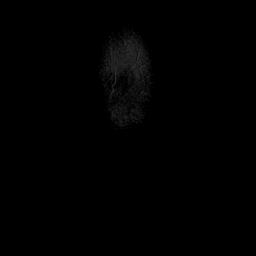
[im 6/24]
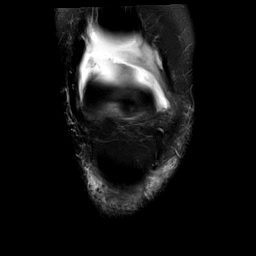
[im 12/24]
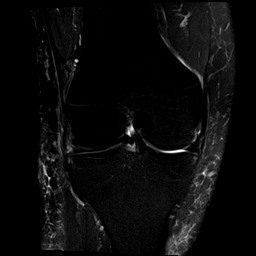
[im 18/24]
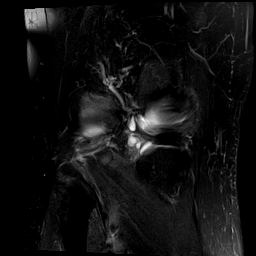
[im 24/24]
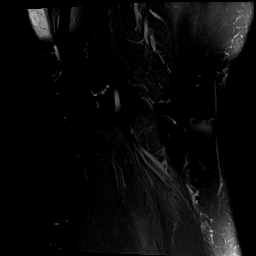

[Series 6: PD fat-sat · coronal · 3.0mm · 0.59mm/px · 6 of 28 slices shown (1 of 3)]
[im 1/28]
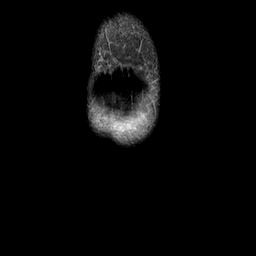
[im 6/28]
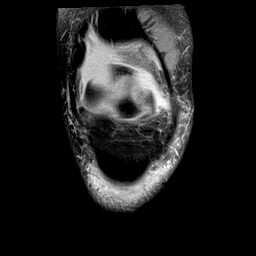
[im 11/28]
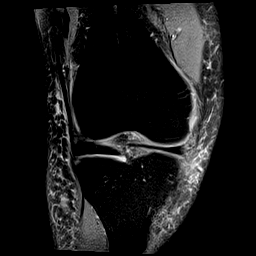
[im 17/28]
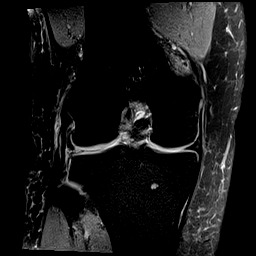
[im 22/28]
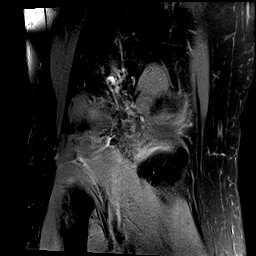
[im 28/28]
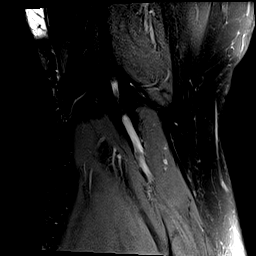

[Series 7: PD fat-sat · sagittal · 3.0mm · 0.59mm/px · 6 of 29 slices shown (2 of 3)]
[im 1/29]
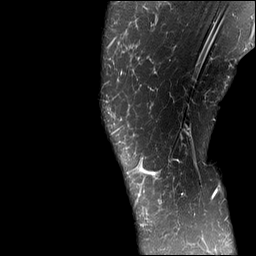
[im 6/29]
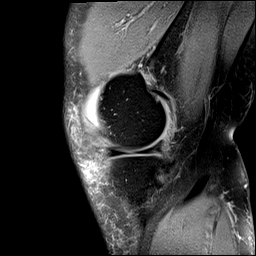
[im 12/29]
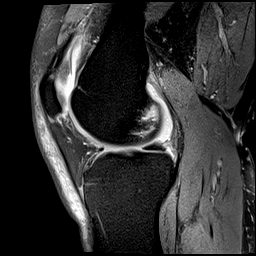
[im 17/29]
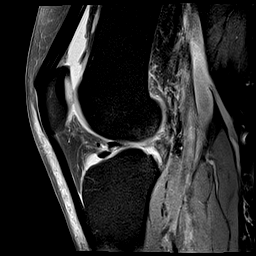
[im 23/29]
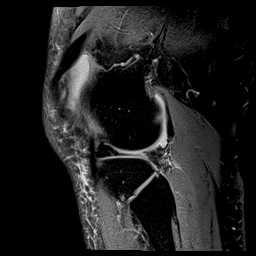
[im 29/29]
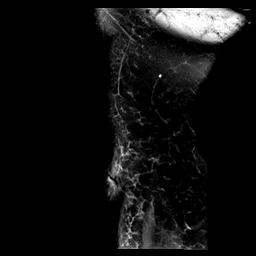

[Series 8: T2 fat-sat · sagittal · 3.0mm · 0.59mm/px · 6 of 29 slices shown (3 of 3)]
[im 1/29]
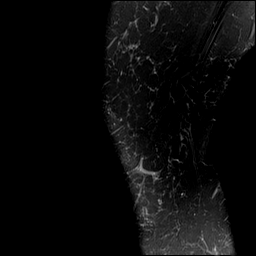
[im 6/29]
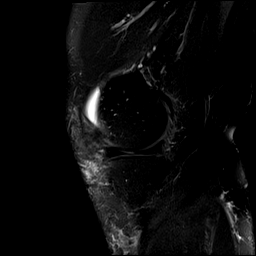
[im 12/29]
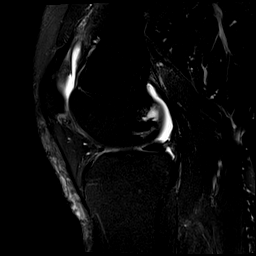
[im 17/29]
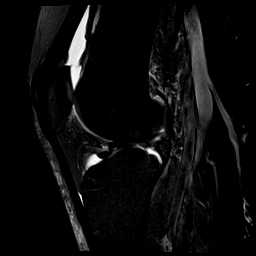
[im 23/29]
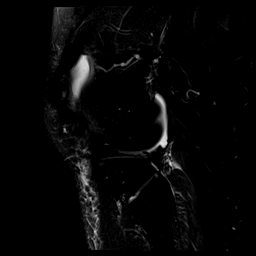
[im 29/29]
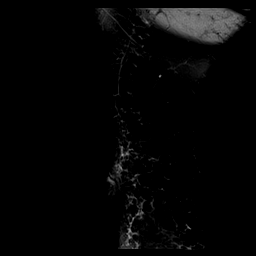

[Series 9: PD · coronal · 2.0mm · 0.47mm/px · 3 of 16 slices shown]
[im 1/16]
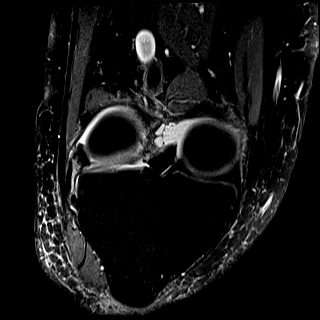
[im 8/16]
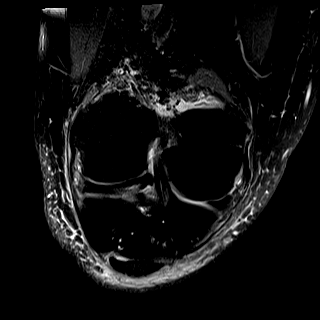
[im 16/16]
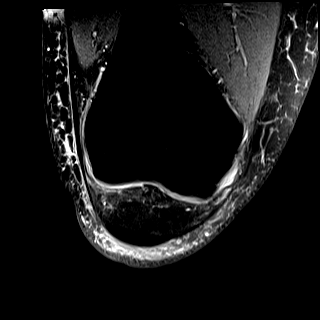

[Series 10: PD fat-sat · coronal · 3.0mm · 0.59mm/px · 6 of 28 slices shown (3 of 3)]
[im 1/28]
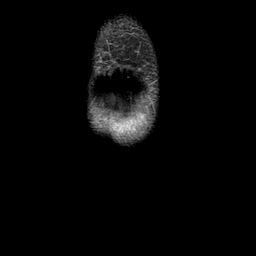
[im 6/28]
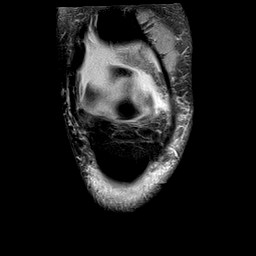
[im 11/28]
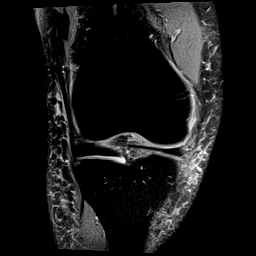
[im 17/28]
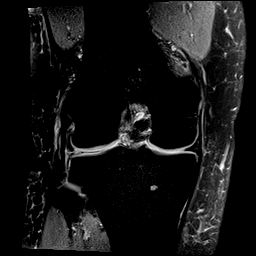
[im 22/28]
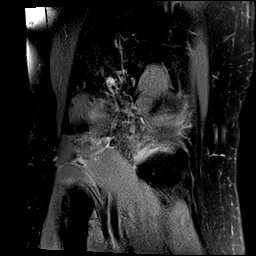
[im 28/28]
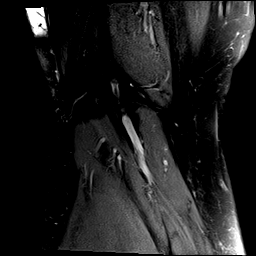

[40 of 40 positions shown; findings below may reference images not displayed]

FINDINGS: MENISCI

Medial meniscus:  Intact.

Lateral meniscus: Large bucket-handle tear of the lateral meniscus
flipped towards the intercondylar notch and anteriorly.

LIGAMENTS

Cruciates:  Intact ACL and PCL.

Collaterals: Medial collateral ligament is intact. Lateral
collateral ligament complex is intact.

CARTILAGE

Patellofemoral:  No chondral defect.

Medial:  No focal chondral defect.

Lateral:  No focal chondral defect.

Joint: Large joint effusion. Normal Hoffa's fat. No plical
thickening.

Popliteal Fossa:  No Baker cyst. Intact popliteus tendon.

Extensor Mechanism: Intact quadriceps tendon. Intact patellar
tendon. Intact medial patellar retinaculum. Intact lateral patellar
retinaculum. Intact MPFL.

Bones:  No acute osseous abnormality.  No aggressive osseous lesion.

Other: Muscles are normal.  No fluid collection or hematoma.
IMPRESSION: 1. Large bucket-handle tear of the lateral meniscus flipped towards
the intercondylar notch and anteriorly.
2. Large joint effusion.

## 2019-02-21 ENCOUNTER — Other Ambulatory Visit: Payer: Self-pay | Admitting: Internal Medicine

## 2019-03-18 DIAGNOSIS — S83281D Other tear of lateral meniscus, current injury, right knee, subsequent encounter: Secondary | ICD-10-CM | POA: Diagnosis not present

## 2019-03-28 ENCOUNTER — Other Ambulatory Visit: Payer: Self-pay | Admitting: Internal Medicine

## 2019-03-28 MED ORDER — FLUTICASONE-SALMETEROL 100-50 MCG/DOSE IN AEPB: 1.0000 | INHALATION_SPRAY | Freq: Two times a day (BID) | RESPIRATORY_TRACT | 0 refills | Status: DC

## 2019-03-28 NOTE — Telephone Encounter (Signed)
Patient stated he will make an appointment 

## 2019-03-28 NOTE — Telephone Encounter (Signed)
Patient called and advised he will need an appointment, he verbalized understanding, appointment for physical scheduled for Thursday, May 28 at 1420, he asks for his labs to be done a day or two earlier, since he has to fast. I advised I will note it on his appointment notes. I advised if the office is still on visit restrictions at the time of his appointment, someone will call to reschedule.

## 2019-04-20 ENCOUNTER — Other Ambulatory Visit: Payer: Self-pay | Admitting: Internal Medicine

## 2019-04-28 ENCOUNTER — Other Ambulatory Visit: Payer: Self-pay

## 2019-04-28 ENCOUNTER — Encounter: Payer: 59 | Admitting: Internal Medicine

## 2019-04-28 ENCOUNTER — Ambulatory Visit (INDEPENDENT_AMBULATORY_CARE_PROVIDER_SITE_OTHER): Payer: 59 | Admitting: Internal Medicine

## 2019-04-28 ENCOUNTER — Other Ambulatory Visit (INDEPENDENT_AMBULATORY_CARE_PROVIDER_SITE_OTHER): Payer: 59

## 2019-04-28 ENCOUNTER — Encounter: Payer: Self-pay | Admitting: Internal Medicine

## 2019-04-28 DIAGNOSIS — Z0000 Encounter for general adult medical examination without abnormal findings: Secondary | ICD-10-CM

## 2019-04-28 LAB — HEPATIC FUNCTION PANEL
ALT: 21 U/L (ref 0–53)
AST: 18 U/L (ref 0–37)
Albumin: 4.4 g/dL (ref 3.5–5.2)
Alkaline Phosphatase: 81 U/L (ref 39–117)
Bilirubin, Direct: 0.1 mg/dL (ref 0.0–0.3)
Total Bilirubin: 0.7 mg/dL (ref 0.2–1.2)
Total Protein: 6.9 g/dL (ref 6.0–8.3)

## 2019-04-28 LAB — LIPID PANEL
Cholesterol: 192 mg/dL (ref 0–200)
HDL: 35.4 mg/dL — ABNORMAL LOW (ref >39.00)
LDL Cholesterol: 125 mg/dL — ABNORMAL HIGH (ref 0–99)
NonHDL: 157.04
Total CHOL/HDL Ratio: 5
Triglycerides: 158 mg/dL — ABNORMAL HIGH (ref 0.0–149.0)
VLDL: 31.6 mg/dL (ref 0.0–40.0)

## 2019-04-28 LAB — CBC WITH DIFFERENTIAL/PLATELET
Basophils Absolute: 0.1 10*3/uL (ref 0.0–0.1)
Basophils Relative: 1 % (ref 0.0–3.0)
Eosinophils Absolute: 0.4 10*3/uL (ref 0.0–0.7)
Eosinophils Relative: 7.4 % — ABNORMAL HIGH (ref 0.0–5.0)
HCT: 42.8 % (ref 39.0–52.0)
Hemoglobin: 14.9 g/dL (ref 13.0–17.0)
Lymphocytes Relative: 22.6 % (ref 12.0–46.0)
Lymphs Abs: 1.3 10*3/uL (ref 0.7–4.0)
MCHC: 34.7 g/dL (ref 30.0–36.0)
MCV: 91.2 fl (ref 78.0–100.0)
Monocytes Absolute: 0.5 10*3/uL (ref 0.1–1.0)
Monocytes Relative: 9.6 % (ref 3.0–12.0)
Neutro Abs: 3.4 10*3/uL (ref 1.4–7.7)
Neutrophils Relative %: 59.4 % (ref 43.0–77.0)
Platelets: 269 10*3/uL (ref 150.0–400.0)
RBC: 4.69 Mil/uL (ref 4.22–5.81)
RDW: 14.6 % (ref 11.5–15.5)
WBC: 5.7 10*3/uL (ref 4.0–10.5)

## 2019-04-28 LAB — BASIC METABOLIC PANEL
BUN: 16 mg/dL (ref 6–23)
CO2: 26 mEq/L (ref 19–32)
Calcium: 9.4 mg/dL (ref 8.4–10.5)
Chloride: 106 mEq/L (ref 96–112)
Creatinine, Ser: 0.95 mg/dL (ref 0.40–1.50)
GFR: 81.52 mL/min (ref >60.00)
Glucose, Bld: 90 mg/dL (ref 70–99)
Potassium: 4.7 mEq/L (ref 3.5–5.1)
Sodium: 140 mEq/L (ref 135–145)

## 2019-04-28 LAB — URINALYSIS
Bilirubin Urine: NEGATIVE
Hgb urine dipstick: NEGATIVE
Ketones, ur: NEGATIVE
Leukocytes,Ua: NEGATIVE
Nitrite: NEGATIVE
Specific Gravity, Urine: 1.02 (ref 1.000–1.030)
Total Protein, Urine: NEGATIVE
Urine Glucose: NEGATIVE
Urobilinogen, UA: 0.2 (ref 0.0–1.0)
pH: 6.5 (ref 5.0–8.0)

## 2019-04-28 LAB — TSH: TSH: 1.59 u[IU]/mL (ref 0.35–4.50)

## 2019-04-28 LAB — PSA: PSA: 1.17 ng/mL (ref 0.10–4.00)

## 2019-04-28 NOTE — Assessment & Plan Note (Signed)
We discussed age appropriate health related issues, including available/recomended screening tests and vaccinations. We discussed a need for adhering to healthy diet and exercise. Labs were ordered to be later reviewed . All questions were answered. Cardiac CT ca score test offered 5/20

## 2019-04-28 NOTE — Progress Notes (Signed)
Subjective:  Patient ID: Justin Wong, male    DOB: 23-Jun-1961  Age: 58 y.o. MRN: [id number]  CC: No chief complaint on file.   HPI Justin Wong presents for a well exam.  The patient does have and left shoulder rotator cuff repair done and right knee meniscal tear repair.  He has recovered well   Outpatient Medications Prior to Visit  Medication Sig Dispense Refill  . Cetirizine HCl (ZYRTEC ALLERGY PO) Take by mouth daily.    Marland Kitchen GLUCOSAMINE HCL-MSM PO Take by mouth.    . Magnesium 400 MG TABS Take by mouth.    . Multiple Vitamins-Minerals (MENS 50+ MULTI VITAMIN/MIN) TABS Take 1 tablet by mouth daily.    . fluticasone (FLONASE) 50 MCG/ACT nasal spray Place 2 sprays into both nostrils daily. Reported on 06/02/2016    . Fluticasone-Salmeterol (ADVAIR DISKUS) 100-50 MCG/DOSE AEPB Inhale 1 puff into the lungs 2 (two) times a day. Keep 04/28/19 appt. 60 each 0  . VENTOLIN HFA 108 (90 Base) MCG/ACT inhaler INHALE 2 PUFFS INTO THE LUNGS EVERY 6 (SIX) HOURS AS NEEDED FOR WHEEZING. 18 Inhaler 5   No facility-administered medications prior to visit.     ROS: Review of Systems  Constitutional: Negative for appetite change, fatigue and unexpected weight change.  HENT: Negative for congestion, nosebleeds, sneezing, sore throat and trouble swallowing.   Eyes: Negative for itching and visual disturbance.  Respiratory: Negative for cough.   Cardiovascular: Negative for chest pain, palpitations and leg swelling.  Gastrointestinal: Negative for abdominal distention, blood in stool, diarrhea and nausea.  Genitourinary: Negative for frequency and hematuria.  Musculoskeletal: Positive for arthralgias and gait problem. Negative for back pain, joint swelling and neck pain.  Skin: Negative for rash.  Neurological: Negative for dizziness, tremors, speech difficulty and weakness.  Psychiatric/Behavioral: Negative for agitation, dysphoric mood, sleep disturbance and suicidal ideas. The patient is  not nervous/anxious.     Objective:  BP 128/82 (BP Location: Left Arm, Patient Position: Sitting, Cuff Size: Large)   Pulse 72   Temp 97.8 F (36.6 C) (Oral)   Ht 5\' 5"  (1.651 m)   Wt 201 lb (91.2 kg)   SpO2 97%   BMI 33.45 kg/m   BP Readings from Last 3 Encounters:  04/28/19 128/82  10/16/18 (!) 137/91  03/09/17 122/74    Wt Readings from Last 3 Encounters:  04/28/19 201 lb (91.2 kg)  10/16/18 190 lb (86.2 kg)  03/09/17 197 lb 1.3 oz (89.4 kg)    Physical Exam Constitutional:      General: He is not in acute distress.    Appearance: He is well-developed.     Comments: NAD  Eyes:     Conjunctiva/sclera: Conjunctivae normal.     Pupils: Pupils are equal, round, and reactive to light.  Neck:     Musculoskeletal: Normal range of motion.     Thyroid: No thyromegaly.     Vascular: No JVD.  Cardiovascular:     Rate and Rhythm: Normal rate and regular rhythm.     Heart sounds: Normal heart sounds. No murmur. No friction rub. No gallop.   Pulmonary:     Effort: Pulmonary effort is normal. No respiratory distress.     Breath sounds: Normal breath sounds. No wheezing or rales.  Chest:     Chest wall: No tenderness.  Abdominal:     General: Bowel sounds are normal. There is no distension.     Palpations: Abdomen is soft. There is no  mass.     Tenderness: There is no abdominal tenderness. There is no guarding or rebound.  Musculoskeletal: Normal range of motion.        General: No tenderness.  Lymphadenopathy:     Cervical: No cervical adenopathy.  Skin:    General: Skin is warm and dry.     Findings: No rash.  Neurological:     Mental Status: He is alert and oriented to person, place, and time.     Cranial Nerves: No cranial nerve deficit.     Motor: No abnormal muscle tone.     Coordination: Coordination normal.     Gait: Gait normal.     Deep Tendon Reflexes: Reflexes are normal and symmetric.  Psychiatric:        Behavior: Behavior normal.        Thought  Content: Thought content normal.        Judgment: Judgment normal.   L shoulder, R knee w/scars  Lab Results  Component Value Date   WBC 6.1 03/09/2017   HGB 15.2 03/09/2017   HCT 44.7 03/09/2017   PLT 280.0 03/09/2017   GLUCOSE 94 03/06/2016   CHOL 202 (H) 03/09/2017   TRIG 140.0 03/09/2017   HDL 37.80 (L) 03/09/2017   LDLDIRECT 150.1 02/17/2012   LDLCALC 136 (H) 03/09/2017   ALT 17 03/09/2017   AST 16 03/09/2017   NA 138 03/06/2016   K 5.3 (H) 03/06/2016   CL 105 03/06/2016   CREATININE 1.09 03/06/2016   BUN 19 03/06/2016   CO2 27 03/06/2016   TSH 1.68 03/09/2017   PSA 0.85 03/09/2017   HGBA1C 5.8 03/06/2015    Mr Knee Right Wo Contrast  Result Date: 11/08/2018 CLINICAL DATA:  7 weeks ago pt was squatting and stood up when he felt a pop in his right knee. Pain and swelling in right knee since. Pain w/ flexion and extension. EXAM: MRI OF THE RIGHT KNEE WITHOUT CONTRAST TECHNIQUE: Multiplanar, multisequence MR imaging of the knee was performed. No intravenous contrast was administered. COMPARISON:  None. FINDINGS: MENISCI Medial meniscus:  Intact. Lateral meniscus: Large bucket-handle tear of the lateral meniscus flipped towards the intercondylar notch and anteriorly. LIGAMENTS Cruciates:  Intact ACL and PCL. Collaterals: Medial collateral ligament is intact. Lateral collateral ligament complex is intact. CARTILAGE Patellofemoral:  No chondral defect. Medial:  No focal chondral defect. Lateral:  No focal chondral defect. Joint: Large joint effusion. Normal Hoffa's fat. No plical thickening. Popliteal Fossa:  No Baker cyst. Intact popliteus tendon. Extensor Mechanism: Intact quadriceps tendon. Intact patellar tendon. Intact medial patellar retinaculum. Intact lateral patellar retinaculum. Intact MPFL. Bones:  No acute osseous abnormality.  No aggressive osseous lesion. Other: Muscles are normal.  No fluid collection or hematoma. IMPRESSION: 1. Large bucket-handle tear of the lateral  meniscus flipped towards the intercondylar notch and anteriorly. 2. Large joint effusion. Electronically Signed   By: Kathreen Devoid   On: 11/08/2018 08:11    Assessment & Plan:   There are no diagnoses linked to this encounter.   No orders of the defined types were placed in this encounter.    Follow-up: No follow-ups on file.  Walker Kehr, MD

## 2019-04-28 NOTE — Patient Instructions (Signed)

## 2019-05-02 ENCOUNTER — Other Ambulatory Visit: Payer: Self-pay | Admitting: *Deleted

## 2019-05-02 MED ORDER — ALBUTEROL SULFATE HFA 108 (90 BASE) MCG/ACT IN AERS: INHALATION_SPRAY | RESPIRATORY_TRACT | 2 refills | Status: DC

## 2019-05-02 MED ORDER — FLUTICASONE-SALMETEROL 100-50 MCG/DOSE IN AEPB
1.0000 | INHALATION_SPRAY | Freq: Two times a day (BID) | RESPIRATORY_TRACT | 2 refills | Status: DC
Start: 2019-05-02 — End: 2019-05-04

## 2019-05-04 ENCOUNTER — Other Ambulatory Visit: Payer: Self-pay | Admitting: Internal Medicine

## 2019-05-04 DIAGNOSIS — Z8249 Family history of ischemic heart disease and other diseases of the circulatory system: Secondary | ICD-10-CM

## 2019-05-04 MED ORDER — FLUTICASONE-SALMETEROL 100-50 MCG/DOSE IN AEPB: 1.0000 | INHALATION_SPRAY | Freq: Two times a day (BID) | RESPIRATORY_TRACT | 11 refills | Status: DC

## 2019-05-04 MED ORDER — ALBUTEROL SULFATE HFA 108 (90 BASE) MCG/ACT IN AERS: INHALATION_SPRAY | RESPIRATORY_TRACT | 5 refills | Status: DC

## 2019-06-08 ENCOUNTER — Encounter: Payer: Self-pay | Admitting: Internal Medicine

## 2020-05-01 ENCOUNTER — Other Ambulatory Visit: Payer: Self-pay

## 2020-05-01 ENCOUNTER — Other Ambulatory Visit: Payer: Self-pay | Admitting: Internal Medicine

## 2020-05-01 ENCOUNTER — Ambulatory Visit (INDEPENDENT_AMBULATORY_CARE_PROVIDER_SITE_OTHER): Payer: 59 | Admitting: Internal Medicine

## 2020-05-01 ENCOUNTER — Encounter: Payer: Self-pay | Admitting: Internal Medicine

## 2020-05-01 VITALS — BP 120/82 | HR 65 | Temp 98.9°F | Ht 65.0 in | Wt 200.8 lb

## 2020-05-01 DIAGNOSIS — J4520 Mild intermittent asthma, uncomplicated: Secondary | ICD-10-CM

## 2020-05-01 DIAGNOSIS — K635 Polyp of colon: Secondary | ICD-10-CM | POA: Diagnosis not present

## 2020-05-01 DIAGNOSIS — Z0000 Encounter for general adult medical examination without abnormal findings: Secondary | ICD-10-CM

## 2020-05-01 LAB — CBC WITH DIFFERENTIAL/PLATELET
Basophils Absolute: 0.1 10*3/uL (ref 0.0–0.1)
Basophils Relative: 1.2 % (ref 0.0–3.0)
Eosinophils Absolute: 0.3 10*3/uL (ref 0.0–0.7)
Eosinophils Relative: 5 % (ref 0.0–5.0)
HCT: 42.3 % (ref 39.0–52.0)
Hemoglobin: 14.2 g/dL (ref 13.0–17.0)
Lymphocytes Relative: 25.7 % (ref 12.0–46.0)
Lymphs Abs: 1.4 10*3/uL (ref 0.7–4.0)
MCHC: 33.6 g/dL (ref 30.0–36.0)
MCV: 93.7 fl (ref 78.0–100.0)
Monocytes Absolute: 0.5 10*3/uL (ref 0.1–1.0)
Monocytes Relative: 8.7 % (ref 3.0–12.0)
Neutro Abs: 3.3 10*3/uL (ref 1.4–7.7)
Neutrophils Relative %: 59.4 % (ref 43.0–77.0)
Platelets: 250 10*3/uL (ref 150.0–400.0)
RBC: 4.52 Mil/uL (ref 4.22–5.81)
RDW: 13.5 % (ref 11.5–15.5)
WBC: 5.6 10*3/uL (ref 4.0–10.5)

## 2020-05-01 LAB — HEPATIC FUNCTION PANEL
ALT: 20 U/L (ref 0–53)
AST: 17 U/L (ref 0–37)
Albumin: 4.5 g/dL (ref 3.5–5.2)
Alkaline Phosphatase: 72 U/L (ref 39–117)
Bilirubin, Direct: 0.1 mg/dL (ref 0.0–0.3)
Total Bilirubin: 0.6 mg/dL (ref 0.2–1.2)
Total Protein: 6.8 g/dL (ref 6.0–8.3)

## 2020-05-01 LAB — BASIC METABOLIC PANEL
BUN: 14 mg/dL (ref 6–23)
CO2: 28 mEq/L (ref 19–32)
Calcium: 9.6 mg/dL (ref 8.4–10.5)
Chloride: 105 mEq/L (ref 96–112)
Creatinine, Ser: 1.06 mg/dL (ref 0.40–1.50)
GFR: 71.59 mL/min (ref >60.00)
Glucose, Bld: 108 mg/dL — ABNORMAL HIGH (ref 70–99)
Potassium: 4.2 mEq/L (ref 3.5–5.1)
Sodium: 139 mEq/L (ref 135–145)

## 2020-05-01 LAB — URINALYSIS
Bilirubin Urine: NEGATIVE
Hgb urine dipstick: NEGATIVE
Ketones, ur: NEGATIVE
Leukocytes,Ua: NEGATIVE
Nitrite: NEGATIVE
Specific Gravity, Urine: 1.025 (ref 1.000–1.030)
Total Protein, Urine: NEGATIVE
Urine Glucose: NEGATIVE
Urobilinogen, UA: 0.2 (ref 0.0–1.0)
pH: 6 (ref 5.0–8.0)

## 2020-05-01 LAB — LIPID PANEL
Cholesterol: 186 mg/dL (ref 0–200)
HDL: 36 mg/dL — ABNORMAL LOW (ref >39.00)
LDL Cholesterol: 119 mg/dL — ABNORMAL HIGH (ref 0–99)
NonHDL: 149.88
Total CHOL/HDL Ratio: 5
Triglycerides: 153 mg/dL — ABNORMAL HIGH (ref 0.0–149.0)
VLDL: 30.6 mg/dL (ref 0.0–40.0)

## 2020-05-01 LAB — TSH: TSH: 2.04 u[IU]/mL (ref 0.35–4.50)

## 2020-05-01 LAB — PSA: PSA: 1.17 ng/mL (ref 0.10–4.00)

## 2020-05-01 MED ORDER — ALBUTEROL SULFATE HFA 108 (90 BASE) MCG/ACT IN AERS: INHALATION_SPRAY | RESPIRATORY_TRACT | 5 refills | Status: DC

## 2020-05-01 MED ORDER — VITAMIN D3 50 MCG (2000 UT) PO CAPS: 2000.0000 [IU] | ORAL_CAPSULE | Freq: Every day | ORAL | 3 refills | Status: AC

## 2020-05-01 MED ORDER — FLUTICASONE-SALMETEROL 100-50 MCG/DOSE IN AEPB: 1.0000 | INHALATION_SPRAY | Freq: Two times a day (BID) | RESPIRATORY_TRACT | 11 refills | Status: DC

## 2020-05-01 NOTE — Assessment & Plan Note (Signed)
We discussed age appropriate health related issues, including available/recomended screening tests and vaccinations. We discussed a need for adhering to healthy diet and exercise. Labs were ordered to be later reviewed . All questions were answered. Cardiac CT ca score test offered/ordered before - not done yet Pt declined COVID 19 vaccination.

## 2020-05-01 NOTE — Assessment & Plan Note (Signed)
[facility]air 

## 2020-05-01 NOTE — Addendum Note (Signed)
Addended by: Trenda Moots on: 0000000 09:42 AM   Modules accepted: Orders

## 2020-05-01 NOTE — Assessment & Plan Note (Signed)
Last colon 2017

## 2020-05-01 NOTE — Progress Notes (Signed)
Subjective:  Patient ID: Justin Wong, male    DOB: 14-Mar-1961  Age: 59 y.o. MRN: [id number]  CC: Annual Exam   HPI [facility] presents for a well exam  Outpatient Medications Prior to Visit  Medication Sig Dispense Refill  . Cetirizine HCl (ZYRTEC ALLERGY PO) Take by mouth daily.    Marland Kitchen GLUCOSAMINE HCL-MSM PO Take by mouth.    . Magnesium 400 MG TABS Take by mouth.    . Multiple Vitamins-Minerals (MENS 50+ MULTI VITAMIN/MIN) TABS Take 1 tablet by mouth daily.    Marland Kitchen albuterol (VENTOLIN HFA) 108 (90 Base) MCG/ACT inhaler INHALE 2 PUFFS INTO THE LUNGS EVERY 6 (SIX) HOURS AS NEEDED FOR WHEEZING. 18 g 5  . Fluticasone-Salmeterol (ADVAIR DISKUS) 100-50 MCG/DOSE AEPB Inhale 1 puff into the lungs 2 (two) times a day. 1 each 11   No facility-administered medications prior to visit.    ROS: Review of Systems  Constitutional: Negative for appetite change, fatigue and unexpected weight change.  HENT: Negative for congestion, nosebleeds, sneezing, sore throat and trouble swallowing.   Eyes: Negative for itching and visual disturbance.  Respiratory: Negative for cough.   Cardiovascular: Negative for chest pain, palpitations and leg swelling.  Gastrointestinal: Negative for abdominal distention, blood in stool, diarrhea and nausea.  Genitourinary: Negative for frequency and hematuria.  Musculoskeletal: Negative for back pain, gait problem, joint swelling and neck pain.  Skin: Negative for rash.  Neurological: Negative for dizziness, tremors, speech difficulty and weakness.  Psychiatric/Behavioral: Negative for agitation, dysphoric mood and sleep disturbance. The patient is not nervous/anxious.     Objective:  BP 120/82 (BP Location: Left Arm)   Pulse 65   Temp 98.9 F (37.2 C) (Oral)   Ht 5\' 5"  (1.651 m)   Wt 200 lb 12.8 oz (91.1 kg)   SpO2 97%   BMI 33.41 kg/m   BP Readings from Last 3 Encounters:  05/01/20 120/82  04/28/19 128/82  10/16/18 (!) 137/91    Wt Readings  from Last 3 Encounters:  05/01/20 200 lb 12.8 oz (91.1 kg)  04/28/19 201 lb (91.2 kg)  10/16/18 190 lb (86.2 kg)    Physical Exam Constitutional:      General: He is not in acute distress.    Appearance: He is well-developed.     Comments: NAD  Eyes:     Conjunctiva/sclera: Conjunctivae normal.     Pupils: Pupils are equal, round, and reactive to light.  Neck:     Thyroid: No thyromegaly.     Vascular: No JVD.  Cardiovascular:     Rate and Rhythm: Normal rate and regular rhythm.     Heart sounds: Normal heart sounds. No murmur. No friction rub. No gallop.   Pulmonary:     Effort: Pulmonary effort is normal. No respiratory distress.     Breath sounds: Normal breath sounds. No wheezing or rales.  Chest:     Chest wall: No tenderness.  Abdominal:     General: Bowel sounds are normal. There is no distension.     Palpations: Abdomen is soft. There is no mass.     Tenderness: There is no abdominal tenderness. There is no guarding or rebound.  Musculoskeletal:        General: No tenderness. Normal range of motion.     Cervical back: Normal range of motion.  Lymphadenopathy:     Cervical: No cervical adenopathy.  Skin:    General: Skin is warm and dry.     Findings:  No rash.  Neurological:     Mental Status: He is alert and oriented to person, place, and time.     Cranial Nerves: No cranial nerve deficit.     Motor: No abnormal muscle tone.     Coordination: Coordination normal.     Gait: Gait normal.     Deep Tendon Reflexes: Reflexes are normal and symmetric.  Psychiatric:        Behavior: Behavior normal.        Thought Content: Thought content normal.        Judgment: Judgment normal.    Pt declined rectal exam   Lab Results  Component Value Date   WBC 5.7 04/28/2019   HGB 14.9 04/28/2019   HCT 42.8 04/28/2019   PLT 269.0 04/28/2019   GLUCOSE 90 04/28/2019   CHOL 192 04/28/2019   TRIG 158.0 (H) 04/28/2019   HDL 35.40 (L) 04/28/2019   LDLDIRECT 150.1  02/17/2012   LDLCALC 125 (H) 04/28/2019   ALT 21 04/28/2019   AST 18 04/28/2019   NA 140 04/28/2019   K 4.7 04/28/2019   CL 106 04/28/2019   CREATININE 0.95 04/28/2019   BUN 16 04/28/2019   CO2 26 04/28/2019   TSH 1.59 04/28/2019   PSA 1.17 04/28/2019   HGBA1C 5.8 03/06/2015    MR KNEE RIGHT WO CONTRAST  Result Date: 11/08/2018 CLINICAL DATA:  7 weeks ago pt was squatting and stood up when he felt a pop in his right knee. Pain and swelling in right knee since. Pain w/ flexion and extension. EXAM: MRI OF THE RIGHT KNEE WITHOUT CONTRAST TECHNIQUE: Multiplanar, multisequence MR imaging of the knee was performed. No intravenous contrast was administered. COMPARISON:  None. FINDINGS: MENISCI Medial meniscus:  Intact. Lateral meniscus: Large bucket-handle tear of the lateral meniscus flipped towards the intercondylar notch and anteriorly. LIGAMENTS Cruciates:  Intact ACL and PCL. Collaterals: Medial collateral ligament is intact. Lateral collateral ligament complex is intact. CARTILAGE Patellofemoral:  No chondral defect. Medial:  No focal chondral defect. Lateral:  No focal chondral defect. Joint: Large joint effusion. Normal Hoffa's fat. No plical thickening. Popliteal Fossa:  No Baker cyst. Intact popliteus tendon. Extensor Mechanism: Intact quadriceps tendon. Intact patellar tendon. Intact medial patellar retinaculum. Intact lateral patellar retinaculum. Intact MPFL. Bones:  No acute osseous abnormality.  No aggressive osseous lesion. Other: Muscles are normal.  No fluid collection or hematoma. IMPRESSION: 1. Large bucket-handle tear of the lateral meniscus flipped towards the intercondylar notch and anteriorly. 2. Large joint effusion. Electronically Signed   By: Kathreen Devoid   On: 11/08/2018 08:11    Assessment & Plan:   There are no diagnoses linked to this encounter.   Meds ordered this encounter  Medications  . albuterol (VENTOLIN HFA) 108 (90 Base) MCG/ACT inhaler    Sig: INHALE 2  PUFFS INTO THE LUNGS EVERY 6 (SIX) HOURS AS NEEDED FOR WHEEZING.    Dispense:  18 g    Refill:  5  . Fluticasone-Salmeterol (ADVAIR DISKUS) 100-50 MCG/DOSE AEPB    Sig: Inhale 1 puff into the lungs 2 (two) times daily.    Dispense:  1 each    Refill:  11     Follow-up: No follow-ups on file.  Walker Kehr, MD

## 2021-05-02 ENCOUNTER — Encounter: Payer: Self-pay | Admitting: Internal Medicine

## 2021-05-02 ENCOUNTER — Ambulatory Visit (INDEPENDENT_AMBULATORY_CARE_PROVIDER_SITE_OTHER): Payer: 59 | Admitting: Internal Medicine

## 2021-05-02 ENCOUNTER — Other Ambulatory Visit: Payer: Self-pay

## 2021-05-02 VITALS — BP 132/78 | HR 68 | Temp 98.2°F | Ht 65.0 in | Wt 195.0 lb

## 2021-05-02 DIAGNOSIS — E785 Hyperlipidemia, unspecified: Secondary | ICD-10-CM

## 2021-05-02 DIAGNOSIS — I82401 Acute embolism and thrombosis of unspecified deep veins of right lower extremity: Secondary | ICD-10-CM | POA: Diagnosis not present

## 2021-05-02 DIAGNOSIS — I82409 Acute embolism and thrombosis of unspecified deep veins of unspecified lower extremity: Secondary | ICD-10-CM | POA: Insufficient documentation

## 2021-05-02 DIAGNOSIS — Z0000 Encounter for general adult medical examination without abnormal findings: Secondary | ICD-10-CM

## 2021-05-02 DIAGNOSIS — K635 Polyp of colon: Secondary | ICD-10-CM | POA: Diagnosis not present

## 2021-05-02 LAB — CBC WITH DIFFERENTIAL/PLATELET
Basophils Absolute: 0 10*3/uL (ref 0.0–0.1)
Basophils Relative: 0.9 % (ref 0.0–3.0)
Eosinophils Absolute: 0.3 10*3/uL (ref 0.0–0.7)
Eosinophils Relative: 5.8 % — ABNORMAL HIGH (ref 0.0–5.0)
HCT: 41.9 % (ref 39.0–52.0)
Hemoglobin: 14.2 g/dL (ref 13.0–17.0)
Lymphocytes Relative: 28.8 % (ref 12.0–46.0)
Lymphs Abs: 1.4 10*3/uL (ref 0.7–4.0)
MCHC: 33.9 g/dL (ref 30.0–36.0)
MCV: 92.5 fl (ref 78.0–100.0)
Monocytes Absolute: 0.5 10*3/uL (ref 0.1–1.0)
Monocytes Relative: 9.5 % (ref 3.0–12.0)
Neutro Abs: 2.7 10*3/uL (ref 1.4–7.7)
Neutrophils Relative %: 55 % (ref 43.0–77.0)
Platelets: 224 10*3/uL (ref 150.0–400.0)
RBC: 4.53 Mil/uL (ref 4.22–5.81)
RDW: 13.6 % (ref 11.5–15.5)
WBC: 5 10*3/uL (ref 4.0–10.5)

## 2021-05-02 LAB — URINALYSIS
Bilirubin Urine: NEGATIVE
Hgb urine dipstick: NEGATIVE
Ketones, ur: NEGATIVE
Leukocytes,Ua: NEGATIVE
Nitrite: NEGATIVE
Specific Gravity, Urine: <=1.005 — AB (ref 1.000–1.030)
Total Protein, Urine: NEGATIVE
Urine Glucose: NEGATIVE
Urobilinogen, UA: 0.2 (ref 0.0–1.0)
pH: 6 (ref 5.0–8.0)

## 2021-05-02 LAB — TSH: TSH: 1.88 u[IU]/mL (ref 0.35–4.50)

## 2021-05-02 LAB — PSA: PSA: 1.48 ng/mL (ref 0.10–4.00)

## 2021-05-02 NOTE — Assessment & Plan Note (Signed)
C/o ? RLE DVT 3 mo ago - he took Eliquis x 3 mo ( Veins) ?after remote  meniscus surgery

## 2021-05-02 NOTE — Addendum Note (Signed)
Addended by: Jacobo Forest on: 05/02/2021 10:02 AM   Modules accepted: Orders

## 2021-05-02 NOTE — Patient Instructions (Signed)

## 2021-05-02 NOTE — Progress Notes (Signed)
Subjective:  Patient ID: Justin Wong, male    DOB: [birth year]  Age: 60 y.o. MRN: [id number]  CC: Annual Exam (Discuss labwork)   HPI Justin Wong presents for a well exam  C/o ? RLE DVT 3 mo ago - he took Eliquis x 3 mo ([facility]) ?after remote  meniscus surgery    Outpatient Medications Prior to Visit  Medication Sig Dispense Refill  . albuterol (VENTOLIN HFA) 108 (90 Base) MCG/ACT inhaler INHALE 2 PUFFS INTO THE LUNGS EVERY 6 (SIX) HOURS AS NEEDED FOR WHEEZING. 18 g 5  . aspirin 81 MG chewable tablet Chew by mouth daily.    . Cetirizine HCl (ZYRTEC ALLERGY PO) Take by mouth daily.    . Cholecalciferol (VITAMIN D3) 50 MCG (2000 UT) capsule Take 1 capsule (2,000 Units total) by mouth daily. 100 capsule 3  . Fluticasone-Salmeterol (ADVAIR DISKUS) 100-50 MCG/DOSE AEPB Inhale 1 puff into the lungs 2 (two) times daily. 1 each 11  . GLUCOSAMINE HCL-MSM PO Take by mouth.    . Magnesium 400 MG TABS Take by mouth.    . Multiple Vitamins-Minerals (MENS 50+ MULTI VITAMIN/MIN) TABS Take 1 tablet by mouth daily.    . Zinc 50 MG CAPS Take by mouth.     No facility-administered medications prior to visit.    ROS: Review of Systems  Constitutional: Negative for appetite change, fatigue and unexpected weight change.  HENT: Negative for congestion, nosebleeds, sneezing, sore throat and trouble swallowing.   Eyes: Negative for itching and visual disturbance.  Respiratory: Negative for cough.   Cardiovascular: Negative for chest pain, palpitations and leg swelling.  Gastrointestinal: Negative for abdominal distention, blood in stool, diarrhea and nausea.  Genitourinary: Negative for frequency and hematuria.  Musculoskeletal: Negative for back pain, gait problem, joint swelling and neck pain.  Skin: Negative for rash.  Neurological: Negative for dizziness, tremors, speech difficulty and weakness.  Psychiatric/Behavioral: Negative for agitation, dysphoric mood and sleep  disturbance. The patient is not nervous/anxious.     Objective:  BP 132/78   Pulse 68   Temp 98.2 F (36.8 C) (Oral)   Ht 5\' 5"  (1.651 m)   Wt 195 lb (88.5 kg)   SpO2 97%   BMI 32.45 kg/m   BP Readings from Last 3 Encounters:  05/02/21 132/78  05/01/20 120/82  04/28/19 128/82    Wt Readings from Last 3 Encounters:  05/02/21 195 lb (88.5 kg)  05/01/20 200 lb 12.8 oz (91.1 kg)  04/28/19 201 lb (91.2 kg)    Physical Exam Constitutional:      General: He is not in acute distress.    Appearance: He is well-developed.     Comments: NAD  Eyes:     Conjunctiva/sclera: Conjunctivae normal.     Pupils: Pupils are equal, round, and reactive to light.  Neck:     Thyroid: No thyromegaly.     Vascular: No JVD.  Cardiovascular:     Rate and Rhythm: Normal rate and regular rhythm.     Heart sounds: Normal heart sounds. No murmur heard. No friction rub. No gallop.   Pulmonary:     Effort: Pulmonary effort is normal. No respiratory distress.     Breath sounds: Normal breath sounds. No wheezing or rales.  Chest:     Chest wall: No tenderness.  Abdominal:     General: Bowel sounds are normal. There is no distension.     Palpations: Abdomen is soft. There is no mass.  Tenderness: There is no abdominal tenderness. There is no guarding or rebound.  Musculoskeletal:        General: No tenderness. Normal range of motion.     Cervical back: Normal range of motion.  Lymphadenopathy:     Cervical: No cervical adenopathy.  Skin:    General: Skin is warm and dry.     Findings: No rash.  Neurological:     Mental Status: He is alert and oriented to person, place, and time.     Cranial Nerves: No cranial nerve deficit.     Motor: No abnormal muscle tone.     Coordination: Coordination normal.     Gait: Gait normal.     Deep Tendon Reflexes: Reflexes are normal and symmetric.  Psychiatric:        Behavior: Behavior normal.        Thought Content: Thought content normal.         Judgment: Judgment normal.     Lab Results  Component Value Date   WBC 5.6 05/01/2020   HGB 14.2 05/01/2020   HCT 42.3 05/01/2020   PLT 250.0 05/01/2020   GLUCOSE 108 (H) 05/01/2020   CHOL 186 05/01/2020   TRIG 153.0 (H) 05/01/2020   HDL 36.00 (L) 05/01/2020   LDLDIRECT 150.1 02/17/2012   LDLCALC 119 (H) 05/01/2020   ALT 20 05/01/2020   AST 17 05/01/2020   NA 139 05/01/2020   K 4.2 05/01/2020   CL 105 05/01/2020   CREATININE 1.06 05/01/2020   BUN 14 05/01/2020   CO2 28 05/01/2020   TSH 2.04 05/01/2020   PSA 1.17 05/01/2020   HGBA1C 5.8 03/06/2015    MR KNEE RIGHT WO CONTRAST  Result Date: 11/08/2018 CLINICAL DATA:  7 weeks ago pt was squatting and stood up when he felt a pop in his right knee. Pain and swelling in right knee since. Pain w/ flexion and extension. EXAM: MRI OF THE RIGHT KNEE WITHOUT CONTRAST TECHNIQUE: Multiplanar, multisequence MR imaging of the knee was performed. No intravenous contrast was administered. COMPARISON:  None. FINDINGS: MENISCI Medial meniscus:  Intact. Lateral meniscus: Large bucket-handle tear of the lateral meniscus flipped towards the intercondylar notch and anteriorly. LIGAMENTS Cruciates:  Intact ACL and PCL. Collaterals: Medial collateral ligament is intact. Lateral collateral ligament complex is intact. CARTILAGE Patellofemoral:  No chondral defect. Medial:  No focal chondral defect. Lateral:  No focal chondral defect. Joint: Large joint effusion. Normal Hoffa's fat. No plical thickening. Popliteal Fossa:  No Baker cyst. Intact popliteus tendon. Extensor Mechanism: Intact quadriceps tendon. Intact patellar tendon. Intact medial patellar retinaculum. Intact lateral patellar retinaculum. Intact MPFL. Bones:  No acute osseous abnormality.  No aggressive osseous lesion. Other: Muscles are normal.  No fluid collection or hematoma. IMPRESSION: 1. Large bucket-handle tear of the lateral meniscus flipped towards the intercondylar notch and anteriorly. 2.  Large joint effusion. Electronically Signed   By: Kathreen Devoid   On: 11/08/2018 08:11    Assessment & Plan:    Walker Kehr, MD

## 2021-05-02 NOTE — Assessment & Plan Note (Signed)
Due colon in 2022

## 2021-05-02 NOTE — Assessment & Plan Note (Signed)
We discussed age appropriate health related issues, including available/recomended screening tests and vaccinations. We discussed a need for adhering to healthy diet and exercise. Labs were ordered to be later reviewed . All questions were answered. Cardiac CT ca score test offered/ordered before - not done yet Pt declined COVID 19 vaccination.

## 2021-05-03 LAB — COMPREHENSIVE METABOLIC PANEL
ALT: 23 U/L (ref 0–53)
AST: 23 U/L (ref 0–37)
Albumin: 4.6 g/dL (ref 3.5–5.2)
Alkaline Phosphatase: 78 U/L (ref 39–117)
BUN: 23 mg/dL (ref 6–23)
CO2: 22 mEq/L (ref 19–32)
Calcium: 9.6 mg/dL (ref 8.4–10.5)
Chloride: 106 mEq/L (ref 96–112)
Creatinine, Ser: 1.05 mg/dL (ref 0.40–1.50)
GFR: 77.6 mL/min (ref >60.00)
Glucose, Bld: 98 mg/dL (ref 70–99)
Potassium: 4.7 mEq/L (ref 3.5–5.1)
Sodium: 140 mEq/L (ref 135–145)
Total Bilirubin: 0.4 mg/dL (ref 0.2–1.2)
Total Protein: 6.9 g/dL (ref 6.0–8.3)

## 2021-05-03 LAB — LIPID PANEL
Cholesterol: 193 mg/dL (ref 0–200)
HDL: 44.4 mg/dL (ref >39.00)
LDL Cholesterol: 130 mg/dL — ABNORMAL HIGH (ref 0–99)
NonHDL: 148.13
Total CHOL/HDL Ratio: 4
Triglycerides: 90 mg/dL (ref 0.0–149.0)
VLDL: 18 mg/dL (ref 0.0–40.0)

## 2021-05-16 ENCOUNTER — Encounter: Payer: Self-pay | Admitting: Internal Medicine

## 2021-07-29 ENCOUNTER — Other Ambulatory Visit: Payer: Self-pay | Admitting: Internal Medicine

## 2021-09-01 ENCOUNTER — Encounter: Payer: Self-pay | Admitting: Gastroenterology

## 2021-09-09 ENCOUNTER — Encounter: Payer: Self-pay | Admitting: Gastroenterology

## 2021-10-02 ENCOUNTER — Ambulatory Visit (AMBULATORY_SURGERY_CENTER): Payer: 59 | Admitting: *Deleted

## 2021-10-02 ENCOUNTER — Encounter: Payer: Self-pay | Admitting: Gastroenterology

## 2021-10-02 VITALS — Ht 65.0 in | Wt 190.0 lb

## 2021-10-02 DIAGNOSIS — Z86010 Personal history of colonic polyps: Secondary | ICD-10-CM

## 2021-10-02 MED ORDER — NA SULFATE-K SULFATE-MG SULF 17.5-3.13-1.6 GM/177ML PO SOLN: 1.0000 | Freq: Once | ORAL | 0 refills | Status: AC

## 2021-10-02 NOTE — Progress Notes (Signed)
No egg or soy allergy known to patient  No issues known to pt with past sedation with any surgeries or procedures Patient denies ever being told they had issues or difficulty with intubation  No FH of Malignant Hyperthermia Pt is not on diet pills Pt is not on  home 02  Pt is not on blood thinners  Pt denies issues with constipation  No A fib or A flutter  Pt is fully vaccinated  for Covid     NO PA's for preps discussed with pt In [facility] today  Discussed with pt there will be an out-of-pocket cost for prep and that varies from $0 to 70 +  dollars - pt verbalized understanding   Due to the COVID-19 pandemic we are asking patients to follow certain guidelines in PV and the [place]   Pt aware of COVID protocols and [facility] guidelines

## 2021-10-14 ENCOUNTER — Other Ambulatory Visit: Payer: Self-pay

## 2021-10-14 ENCOUNTER — Encounter: Payer: Self-pay | Admitting: Gastroenterology

## 2021-10-14 ENCOUNTER — Ambulatory Visit (AMBULATORY_SURGERY_CENTER): Payer: 59 | Admitting: Gastroenterology

## 2021-10-14 VITALS — BP 121/70 | HR 65 | Temp 98.7°F | Resp 13 | Ht 65.0 in | Wt 190.0 lb

## 2021-10-14 DIAGNOSIS — D125 Benign neoplasm of sigmoid colon: Secondary | ICD-10-CM

## 2021-10-14 DIAGNOSIS — D122 Benign neoplasm of ascending colon: Secondary | ICD-10-CM

## 2021-10-14 DIAGNOSIS — Z86010 Personal history of colonic polyps: Secondary | ICD-10-CM

## 2021-10-14 MED ORDER — SODIUM CHLORIDE 0.9 % IV SOLN: 500.0000 mL | Freq: Once | INTRAVENOUS | Status: DC

## 2021-10-14 NOTE — Op Note (Signed)
[place] Patient Name: Justin Wong Procedure Date: 10/14/2021 2:36 PM MRN: [id number] Endoscopist: Ladene Artist , MD Age: 60 Referring MD:  Date of Birth: 03-Jul-1961 Gender: Male Account #: [id number] Procedure:                Colonoscopy Indications:              Surveillance: Personal history of adenomatous and                            sessile serrated polyps on last colonoscopy 5 years                            ago Medicines:                Monitored Anesthesia Care Procedure:                Pre-Anesthesia Assessment:                           - Prior to the procedure, a History and Physical                            was performed, and patient medications and                            allergies were reviewed. The patient's tolerance of                            previous anesthesia was also reviewed. The risks                            and benefits of the procedure and the sedation                            options and risks were discussed with the patient.                            All questions were answered, and informed consent                            was obtained. Prior Anticoagulants: The patient has                            taken no previous anticoagulant or antiplatelet                            agents. ASA Grade Assessment: II - A patient with                            mild systemic disease. After reviewing the risks                            and benefits, the patient was deemed in  satisfactory condition to undergo the procedure.                           After obtaining informed consent, the colonoscope                            was passed under direct vision. Throughout the                            procedure, the patient's blood pressure, pulse, and                            oxygen saturations were monitored continuously. The                            CF [id number] #[id number] was introduced through the anus                             and advanced to the the cecum, identified by                            appendiceal orifice and ileocecal valve. The                            ileocecal valve, appendiceal orifice, and rectum                            were photographed. The quality of the bowel                            preparation was good after extensive lavage,                            suction. The colonoscopy was performed without                            difficulty. The patient tolerated the procedure                            well. Scope In: 2:45:16 PM Scope Out: 2:58:20 PM Scope Withdrawal Time: 0 hours 11 minutes 24 seconds  Total Procedure Duration: 0 hours 13 minutes 4 seconds  Findings:                 Skin tags were found on perianal exam.                           A 4 mm polyp was found in the ascending colon. The                            polyp was sessile. The polyp was removed with a                            cold biopsy forceps. Resection and retrieval were  complete.                           A 6 mm polyp was found in the sigmoid colon. The                            polyp was sessile. The polyp was removed with a                            cold snare. Resection and retrieval were complete.                           Multiple medium-mouthed diverticula were found in                            the left colon. There was no evidence of                            diverticular bleeding.                           Internal hemorrhoids were found during                            retroflexion. The hemorrhoids were small and Grade                            I (internal hemorrhoids that do not prolapse).                           The exam was otherwise without abnormality on                            direct and retroflexion views. Complications:            No immediate complications. Estimated blood loss:                            None. Estimated  Blood Loss:     Estimated blood loss: none. Impression:               - Perianal skin tags found on perianal exam.                           - One 4 mm polyp in the ascending colon, removed                            with a cold biopsy forceps. Resected and retrieved.                           - One 6 mm polyp in the sigmoid colon, removed with                            a cold snare. Resected and retrieved.                           -  Mild diverticulosis in the left colon.                           - Internal hemorrhoids.                           - The examination was otherwise normal on direct                            and retroflexion views. Recommendation:           - Repeat colonoscopy after studies are complete for                            surveillance based on pathology results.                           - Patient has a contact number available for                            emergencies. The signs and symptoms of potential                            delayed complications were discussed with the                            patient. Return to normal activities tomorrow.                            Written discharge instructions were provided to the                            patient.                           - High fiber diet.                           - Continue present medications.                           - Await pathology results. Ladene Artist, MD 10/14/2021 3:02:26 PM This report has been signed electronically.

## 2021-10-14 NOTE — Progress Notes (Signed)
Called to room to assist during endoscopic procedure.  Patient ID and intended procedure confirmed with present staff. Received instructions for my participation in the procedure from the performing physician.  

## 2021-10-14 NOTE — Patient Instructions (Signed)
Information on polyps, diverticulosis and hemorrhoids given to you today.  Await pathology results.  Eat a high fiber diet and continue present medications.    YOU HAD AN ENDOSCOPIC PROCEDURE TODAY AT [facility]:   Refer to the procedure report that was given to you for any specific questions about what was found during the examination.  If the procedure report does not answer your questions, please call your gastroenterologist to clarify.  If you requested that your care partner not be given the details of your procedure findings, then the procedure report has been included in a sealed envelope for you to review at your convenience later.  YOU SHOULD EXPECT: Some feelings of bloating in the abdomen. Passage of more gas than usual.  Walking can help get rid of the air that was put into your GI tract during the procedure and reduce the bloating. If you had a lower endoscopy (such as a colonoscopy or flexible sigmoidoscopy) you may notice spotting of blood in your stool or on the toilet paper. If you underwent a bowel prep for your procedure, you may not have a normal bowel movement for a few days.  Please Note:  You might notice some irritation and congestion in your nose or some drainage.  This is from the oxygen used during your procedure.  There is no need for concern and it should clear up in a day or so.  SYMPTOMS TO REPORT IMMEDIATELY:  Following lower endoscopy (colonoscopy or flexible sigmoidoscopy):  Excessive amounts of blood in the stool  Significant tenderness or worsening of abdominal pains  Swelling of the abdomen that is new, acute  Fever of 100F or higher   For urgent or emergent issues, a gastroenterologist can be reached at any hour by calling [phone]. Do not use MyChart messaging for urgent concerns.    DIET:  We do recommend a small meal at first, but then you may proceed to your regular diet.  Drink plenty of fluids but you should avoid  alcoholic beverages for 24 hours.  ACTIVITY:  You should plan to take it easy for the rest of today and you should NOT DRIVE or use heavy machinery until tomorrow (because of the sedation medicines used during the test).    FOLLOW UP: Our staff will call the number listed on your records 48-72 hours following your procedure to check on you and address any questions or concerns that you may have regarding the information given to you following your procedure. If we do not reach you, we will leave a message.  We will attempt to reach you two times.  During this call, we will ask if you have developed any symptoms of COVID 19. If you develop any symptoms (ie: fever, flu-like symptoms, shortness of breath, cough etc.) before then, please call [phone].  If you test positive for Covid 19 in the 2 weeks post procedure, please call and report this information to [place].    If any biopsies were taken you will be contacted by phone or by letter within the next 1-3 weeks.  Please call us at [phone] if you have not heard about the biopsies in 3 weeks.    SIGNATURES/CONFIDENTIALITY: You and/or your care partner have signed paperwork which will be entered into your electronic medical record.  These signatures attest to the fact that that the information above on your After Visit Summary has been reviewed and is understood.  Full responsibility of the confidentiality of this  discharge information lies with you and/or your care-partner.  

## 2021-10-14 NOTE — Progress Notes (Signed)
Pt's states no medical or surgical changes since previsit or office visit. 

## 2021-10-14 NOTE — Progress Notes (Signed)
Report given to PACU, vss 

## 2021-10-14 NOTE — Progress Notes (Signed)
VS taken by C.W. 

## 2021-10-16 ENCOUNTER — Telehealth: Payer: Self-pay

## 2021-10-16 NOTE — Telephone Encounter (Signed)
  Follow up Call-  Call back number 10/14/2021  Post procedure Call Back phone  # [phone]  Permission to leave phone message Yes  Some recent data might be hidden     Patient questions:  Do you have a fever, pain , or abdominal swelling? No. Pain Score  0 *  Have you tolerated food without any problems? Yes.    Have you been able to return to your normal activities? Yes.    Do you have any questions about your discharge instructions: Diet   No. Medications  No. Follow up visit  No.  Do you have questions or concerns about your Care? No.  Actions: * If pain score is 4 or above: No action needed, pain <4.   Have you developed a fever since your procedure? no  2.   Have you had an respiratory symptoms (SOB or cough) since your procedure? no  3.   Have you tested positive for COVID 19 since your procedure no  4.   Have you had any family members/close contacts diagnosed with the COVID 19 since your procedure?  no   If yes to any of these questions please route to Joylene John, RN and Joella Prince, RN

## 2021-10-29 ENCOUNTER — Encounter: Payer: Self-pay | Admitting: Gastroenterology

## 2022-03-09 ENCOUNTER — Encounter: Payer: Self-pay | Admitting: Internal Medicine

## 2022-03-10 MED ORDER — ALBUTEROL SULFATE HFA 108 (90 BASE) MCG/ACT IN AERS: INHALATION_SPRAY | RESPIRATORY_TRACT | 5 refills | Status: DC

## 2022-05-05 ENCOUNTER — Encounter: Payer: Self-pay | Admitting: Internal Medicine

## 2022-05-05 ENCOUNTER — Ambulatory Visit (INDEPENDENT_AMBULATORY_CARE_PROVIDER_SITE_OTHER): Payer: 59 | Admitting: Internal Medicine

## 2022-05-05 VITALS — BP 120/78 | HR 66 | Temp 98.2°F | Ht 65.0 in | Wt 196.0 lb

## 2022-05-05 DIAGNOSIS — M1990 Unspecified osteoarthritis, unspecified site: Secondary | ICD-10-CM | POA: Insufficient documentation

## 2022-05-05 DIAGNOSIS — Z0000 Encounter for general adult medical examination without abnormal findings: Secondary | ICD-10-CM | POA: Diagnosis not present

## 2022-05-05 DIAGNOSIS — M1991 Primary osteoarthritis, unspecified site: Secondary | ICD-10-CM | POA: Diagnosis not present

## 2022-05-05 LAB — CBC WITH DIFFERENTIAL/PLATELET
Basophils Absolute: 0.1 10*3/uL (ref 0.0–0.1)
Basophils Relative: 1.2 % (ref 0.0–3.0)
Eosinophils Absolute: 0.6 10*3/uL (ref 0.0–0.7)
Eosinophils Relative: 8.8 % — ABNORMAL HIGH (ref 0.0–5.0)
HCT: 43.6 % (ref 39.0–52.0)
Hemoglobin: 14.7 g/dL (ref 13.0–17.0)
Lymphocytes Relative: 22.9 % (ref 12.0–46.0)
Lymphs Abs: 1.5 10*3/uL (ref 0.7–4.0)
MCHC: 33.7 g/dL (ref 30.0–36.0)
MCV: 93.8 fl (ref 78.0–100.0)
Monocytes Absolute: 0.5 10*3/uL (ref 0.1–1.0)
Monocytes Relative: 8.3 % (ref 3.0–12.0)
Neutro Abs: 3.7 10*3/uL (ref 1.4–7.7)
Neutrophils Relative %: 58.8 % (ref 43.0–77.0)
Platelets: 256 10*3/uL (ref 150.0–400.0)
RBC: 4.64 Mil/uL (ref 4.22–5.81)
RDW: 13 % (ref 11.5–15.5)
WBC: 6.3 10*3/uL (ref 4.0–10.5)

## 2022-05-05 LAB — COMPREHENSIVE METABOLIC PANEL
ALT: 22 U/L (ref 0–53)
AST: 21 U/L (ref 0–37)
Albumin: 4.6 g/dL (ref 3.5–5.2)
Alkaline Phosphatase: 75 U/L (ref 39–117)
BUN: 17 mg/dL (ref 6–23)
CO2: 28 mEq/L (ref 19–32)
Calcium: 9.7 mg/dL (ref 8.4–10.5)
Chloride: 104 mEq/L (ref 96–112)
Creatinine, Ser: 1.16 mg/dL (ref 0.40–1.50)
GFR: 68.37 mL/min (ref >60.00)
Glucose, Bld: 99 mg/dL (ref 70–99)
Potassium: 5.3 mEq/L — ABNORMAL HIGH (ref 3.5–5.1)
Sodium: 138 mEq/L (ref 135–145)
Total Bilirubin: 0.6 mg/dL (ref 0.2–1.2)
Total Protein: 7.1 g/dL (ref 6.0–8.3)

## 2022-05-05 LAB — LIPID PANEL
Cholesterol: 197 mg/dL (ref 0–200)
HDL: 42.4 mg/dL (ref >39.00)
LDL Cholesterol: 134 mg/dL — ABNORMAL HIGH (ref 0–99)
NonHDL: 154.48
Total CHOL/HDL Ratio: 5
Triglycerides: 102 mg/dL (ref 0.0–149.0)
VLDL: 20.4 mg/dL (ref 0.0–40.0)

## 2022-05-05 LAB — URINALYSIS
Bilirubin Urine: NEGATIVE
Hgb urine dipstick: NEGATIVE
Ketones, ur: NEGATIVE
Leukocytes,Ua: NEGATIVE
Nitrite: NEGATIVE
Specific Gravity, Urine: 1.02 (ref 1.000–1.030)
Total Protein, Urine: NEGATIVE
Urine Glucose: NEGATIVE
Urobilinogen, UA: 0.2 (ref 0.0–1.0)
pH: 6 (ref 5.0–8.0)

## 2022-05-05 LAB — TSH: TSH: 1.97 u[IU]/mL (ref 0.35–5.50)

## 2022-05-05 LAB — PSA: PSA: 1.8 ng/mL (ref 0.10–4.00)

## 2022-05-05 MED ORDER — FLUTICASONE-SALMETEROL 100-50 MCG/ACT IN AEPB: INHALATION_SPRAY | RESPIRATORY_TRACT | 3 refills | Status: DC

## 2022-05-05 MED ORDER — ALBUTEROL SULFATE HFA 108 (90 BASE) MCG/ACT IN AERS: INHALATION_SPRAY | RESPIRATORY_TRACT | 5 refills | Status: DC

## 2022-05-05 NOTE — Assessment & Plan Note (Signed)

## 2022-05-05 NOTE — Assessment & Plan Note (Signed)
Blue-Emu cream was recommended to use 2-3 times a day ? ?

## 2022-05-05 NOTE — Patient Instructions (Signed)
Blue-Emu cream -- use 2-3 times a day ? ?

## 2022-05-05 NOTE — Progress Notes (Signed)
Subjective:  Patient ID: Justin Wong, male    DOB: 12/03/1960  Age: 61 y.o. MRN: [id number]  CC: No chief complaint on file.   HPI Justin Wong presents for a well exam. C/o allergies, OA.  Outpatient Medications Prior to Visit  Medication Sig Dispense Refill   aspirin 81 MG chewable tablet Chew by mouth daily.     Cetirizine HCl (ZYRTEC ALLERGY PO) Take by mouth daily.     Cholecalciferol (VITAMIN D3) 50 MCG (2000 UT) capsule Take 1 capsule (2,000 Units total) by mouth daily. 100 capsule 3   GLUCOSAMINE HCL-MSM PO Take by mouth.     Magnesium 400 MG TABS Take by mouth.     Multiple Vitamins-Minerals (MENS 50+ MULTI VITAMIN/MIN) TABS Take 1 tablet by mouth daily.     Zinc 50 MG CAPS Take by mouth.     albuterol (VENTOLIN HFA) 108 (90 Base) MCG/ACT inhaler INHALE 2 PUFFS INTO THE LUNGS EVERY 6 (SIX) HOURS AS NEEDED FOR WHEEZING. 18 g 5   fluticasone-salmeterol (ADVAIR DISKUS) 100-50 MCG/ACT AEPB TAKE 1 PUFF BY MOUTH TWICE A DAY 3 each 3   No facility-administered medications prior to visit.    ROS: Review of Systems  Constitutional:  Negative for appetite change, fatigue and unexpected weight change.  HENT:  Positive for postnasal drip and rhinorrhea. Negative for congestion, nosebleeds, sneezing, sore throat and trouble swallowing.   Eyes:  Negative for itching and visual disturbance.  Respiratory:  Negative for cough.   Cardiovascular:  Negative for chest pain, palpitations and leg swelling.  Gastrointestinal:  Negative for abdominal distention, blood in stool, diarrhea and nausea.  Genitourinary:  Negative for frequency and hematuria.  Musculoskeletal:  Negative for back pain, gait problem, joint swelling and neck pain.  Skin:  Negative for rash.  Neurological:  Negative for dizziness, tremors, speech difficulty and weakness.  Psychiatric/Behavioral:  Negative for agitation, dysphoric mood and sleep disturbance. The patient is not nervous/anxious.    Objective:   BP 120/78 (BP Location: Left Arm, Patient Position: Sitting, Cuff Size: Large)   Pulse 66   Temp 98.2 F (36.8 C) (Oral)   Ht '5\' 5"'$  (1.651 m)   Wt 196 lb (88.9 kg)   SpO2 95%   BMI 32.62 kg/m   BP Readings from Last 3 Encounters:  05/05/22 120/78  10/14/21 121/70  05/02/21 132/78    Wt Readings from Last 3 Encounters:  05/05/22 196 lb (88.9 kg)  10/14/21 190 lb (86.2 kg)  10/02/21 190 lb (86.2 kg)    Physical Exam Constitutional:      General: He is not in acute distress.    Appearance: He is well-developed. He is obese.     Comments: NAD  Eyes:     Conjunctiva/sclera: Conjunctivae normal.     Pupils: Pupils are equal, round, and reactive to light.  Neck:     Thyroid: No thyromegaly.     Vascular: No JVD.  Cardiovascular:     Rate and Rhythm: Normal rate and regular rhythm.     Heart sounds: Normal heart sounds. No murmur heard.   No friction rub. No gallop.  Pulmonary:     Effort: Pulmonary effort is normal. No respiratory distress.     Breath sounds: Normal breath sounds. No wheezing or rales.  Chest:     Chest wall: No tenderness.  Abdominal:     General: Bowel sounds are normal. There is no distension.     Palpations: Abdomen is soft. There  is no mass.     Tenderness: There is no abdominal tenderness. There is no guarding or rebound.  Musculoskeletal:        General: No tenderness. Normal range of motion.     Cervical back: Normal range of motion.  Lymphadenopathy:     Cervical: No cervical adenopathy.  Skin:    General: Skin is warm and dry.     Findings: No rash.  Neurological:     Mental Status: He is alert and oriented to person, place, and time.     Cranial Nerves: No cranial nerve deficit.     Motor: No abnormal muscle tone.     Coordination: Coordination normal.     Gait: Gait normal.     Deep Tendon Reflexes: Reflexes are normal and symmetric.  Psychiatric:        Behavior: Behavior normal.        Thought Content: Thought content normal.         Judgment: Judgment normal.  Rectal per GI  Lab Results  Component Value Date   WBC 5.0 05/02/2021   HGB 14.2 05/02/2021   HCT 41.9 05/02/2021   PLT 224.0 05/02/2021   GLUCOSE 98 05/02/2021   CHOL 193 05/02/2021   TRIG 90.0 05/02/2021   HDL 44.40 05/02/2021   LDLDIRECT 150.1 02/17/2012   LDLCALC 130 (H) 05/02/2021   ALT 23 05/02/2021   AST 23 05/02/2021   NA 140 05/02/2021   K 4.7 05/02/2021   CL 106 05/02/2021   CREATININE 1.05 05/02/2021   BUN 23 05/02/2021   CO2 22 05/02/2021   TSH 1.88 05/02/2021   PSA 1.48 05/02/2021   HGBA1C 5.8 03/06/2015    MR KNEE RIGHT WO CONTRAST  Result Date: 11/08/2018 CLINICAL DATA:  7 weeks ago pt was squatting and stood up when he felt a pop in his right knee. Pain and swelling in right knee since. Pain w/ flexion and extension. EXAM: MRI OF THE RIGHT KNEE WITHOUT CONTRAST TECHNIQUE: Multiplanar, multisequence MR imaging of the knee was performed. No intravenous contrast was administered. COMPARISON:  None. FINDINGS: MENISCI Medial meniscus:  Intact. Lateral meniscus: Large bucket-handle tear of the lateral meniscus flipped towards the intercondylar notch and anteriorly. LIGAMENTS Cruciates:  Intact ACL and PCL. Collaterals: Medial collateral ligament is intact. Lateral collateral ligament complex is intact. CARTILAGE Patellofemoral:  No chondral defect. Medial:  No focal chondral defect. Lateral:  No focal chondral defect. Joint: Large joint effusion. Normal Hoffa's fat. No plical thickening. Popliteal Fossa:  No Baker cyst. Intact popliteus tendon. Extensor Mechanism: Intact quadriceps tendon. Intact patellar tendon. Intact medial patellar retinaculum. Intact lateral patellar retinaculum. Intact MPFL. Bones:  No acute osseous abnormality.  No aggressive osseous lesion. Other: Muscles are normal.  No fluid collection or hematoma. IMPRESSION: 1. Large bucket-handle tear of the lateral meniscus flipped towards the intercondylar notch and anteriorly.  2. Large joint effusion. Electronically Signed   By: Kathreen Devoid   On: 11/08/2018 08:11    Assessment & Plan:   Problem List Items Addressed This Visit     Osteoarthritis    Blue-Emu cream was recommended to use 2-3 times a day        Well adult exam - Primary     We discussed age appropriate health related issues, including available/recomended screening tests and vaccinations. Labs were ordered to be later reviewed . All questions were answered. We discussed one or more of the following - seat belt use, use of sunscreen/sun exposure exercise,  fall risk reduction, second hand smoke exposure, firearm use and storage, seat belt use, a need for adhering to healthy diet and exercise. Labs were ordered.  All questions were answered.        Relevant Orders   TSH   Urinalysis   CBC with Differential/Platelet   Lipid panel   PSA   Comprehensive metabolic panel      Meds ordered this encounter  Medications   albuterol (VENTOLIN HFA) 108 (90 Base) MCG/ACT inhaler    Sig: INHALE 2 PUFFS INTO THE LUNGS EVERY 6 (SIX) HOURS AS NEEDED FOR WHEEZING.    Dispense:  18 g    Refill:  5   fluticasone-salmeterol (ADVAIR DISKUS) 100-50 MCG/ACT AEPB    Sig: TAKE 1 PUFF BY MOUTH TWICE A DAY    Dispense:  3 each    Refill:  3      Follow-up: Return in about 1 year (around 05/06/2023) for Wellness Exam.  Walker Kehr, MD

## 2023-01-29 DIAGNOSIS — S50851A Superficial foreign body of right forearm, initial encounter: Secondary | ICD-10-CM | POA: Diagnosis not present

## 2023-01-29 DIAGNOSIS — L03113 Cellulitis of right upper limb: Secondary | ICD-10-CM | POA: Diagnosis not present

## 2023-05-09 DIAGNOSIS — R519 Headache, unspecified: Secondary | ICD-10-CM | POA: Diagnosis not present

## 2023-05-09 DIAGNOSIS — R0981 Nasal congestion: Secondary | ICD-10-CM | POA: Diagnosis not present

## 2023-05-11 ENCOUNTER — Ambulatory Visit: Payer: BC Managed Care – PPO | Admitting: Internal Medicine

## 2023-05-11 ENCOUNTER — Encounter: Payer: Self-pay | Admitting: Internal Medicine

## 2023-05-11 VITALS — BP 118/70 | HR 91 | Temp 100.7°F | Ht 65.0 in | Wt 193.0 lb

## 2023-05-11 DIAGNOSIS — K635 Polyp of colon: Secondary | ICD-10-CM

## 2023-05-11 DIAGNOSIS — Z0001 Encounter for general adult medical examination with abnormal findings: Secondary | ICD-10-CM

## 2023-05-11 DIAGNOSIS — Z13220 Encounter for screening for lipoid disorders: Secondary | ICD-10-CM

## 2023-05-11 DIAGNOSIS — J019 Acute sinusitis, unspecified: Secondary | ICD-10-CM | POA: Insufficient documentation

## 2023-05-11 DIAGNOSIS — R7989 Other specified abnormal findings of blood chemistry: Secondary | ICD-10-CM

## 2023-05-11 DIAGNOSIS — J0100 Acute maxillary sinusitis, unspecified: Secondary | ICD-10-CM

## 2023-05-11 DIAGNOSIS — Z0000 Encounter for general adult medical examination without abnormal findings: Secondary | ICD-10-CM

## 2023-05-11 LAB — CBC WITH DIFFERENTIAL/PLATELET
Basophils Absolute: 0.1 10*3/uL (ref 0.0–0.1)
Basophils Relative: 1.2 % (ref 0.0–3.0)
Eosinophils Absolute: 0.1 10*3/uL (ref 0.0–0.7)
Eosinophils Relative: 0.6 % (ref 0.0–5.0)
HCT: 41 % (ref 39.0–52.0)
Hemoglobin: 13.6 g/dL (ref 13.0–17.0)
Lymphocytes Relative: 4 % — ABNORMAL LOW (ref 12.0–46.0)
Lymphs Abs: 0.4 10*3/uL — ABNORMAL LOW (ref 0.7–4.0)
MCHC: 33.2 g/dL (ref 30.0–36.0)
MCV: 94.1 fl (ref 78.0–100.0)
Monocytes Absolute: 1.1 10*3/uL — ABNORMAL HIGH (ref 0.1–1.0)
Monocytes Relative: 11.6 % (ref 3.0–12.0)
Neutro Abs: 8.1 10*3/uL — ABNORMAL HIGH (ref 1.4–7.7)
Neutrophils Relative %: 82.6 % — ABNORMAL HIGH (ref 43.0–77.0)
Platelets: 286 10*3/uL (ref 150.0–400.0)
RBC: 4.36 Mil/uL (ref 4.22–5.81)
RDW: 12.8 % (ref 11.5–15.5)
WBC: 9.8 10*3/uL (ref 4.0–10.5)

## 2023-05-11 LAB — LIPID PANEL
Cholesterol: 163 mg/dL (ref 0–200)
HDL: 40.9 mg/dL (ref >39.00)
LDL Cholesterol: 94 mg/dL (ref 0–99)
NonHDL: 121.84
Total CHOL/HDL Ratio: 4
Triglycerides: 137 mg/dL (ref 0.0–149.0)
VLDL: 27.4 mg/dL (ref 0.0–40.0)

## 2023-05-11 LAB — URINALYSIS
Bilirubin Urine: NEGATIVE
Hgb urine dipstick: NEGATIVE
Ketones, ur: NEGATIVE
Leukocytes,Ua: NEGATIVE
Nitrite: NEGATIVE
Specific Gravity, Urine: 1.025 (ref 1.000–1.030)
Urine Glucose: NEGATIVE
Urobilinogen, UA: 1 (ref 0.0–1.0)
pH: 6 (ref 5.0–8.0)

## 2023-05-11 LAB — COMPREHENSIVE METABOLIC PANEL
ALT: 65 U/L — ABNORMAL HIGH (ref 0–53)
AST: 64 U/L — ABNORMAL HIGH (ref 0–37)
Albumin: 4.2 g/dL (ref 3.5–5.2)
Alkaline Phosphatase: 92 U/L (ref 39–117)
BUN: 18 mg/dL (ref 6–23)
CO2: 27 mEq/L (ref 19–32)
Calcium: 9.1 mg/dL (ref 8.4–10.5)
Chloride: 97 mEq/L (ref 96–112)
Creatinine, Ser: 1.03 mg/dL (ref 0.40–1.50)
GFR: 78.29 mL/min (ref >60.00)
Glucose, Bld: 102 mg/dL — ABNORMAL HIGH (ref 70–99)
Potassium: 4.7 mEq/L (ref 3.5–5.1)
Sodium: 135 mEq/L (ref 135–145)
Total Bilirubin: 0.4 mg/dL (ref 0.2–1.2)
Total Protein: 6.8 g/dL (ref 6.0–8.3)

## 2023-05-11 LAB — PSA: PSA: 2.29 ng/mL (ref 0.10–4.00)

## 2023-05-11 LAB — TSH: TSH: 1.24 u[IU]/mL (ref 0.35–5.50)

## 2023-05-11 MED ORDER — FLUTICASONE-SALMETEROL 100-50 MCG/ACT IN AEPB: INHALATION_SPRAY | RESPIRATORY_TRACT | 3 refills | Status: DC

## 2023-05-11 MED ORDER — ALBUTEROL SULFATE HFA 108 (90 BASE) MCG/ACT IN AERS: INHALATION_SPRAY | RESPIRATORY_TRACT | 5 refills | Status: DC

## 2023-05-11 MED ORDER — AZELASTINE HCL 137 MCG/SPRAY NA SOLN: 2.0000 | Freq: Every day | NASAL | 3 refills | Status: AC

## 2023-05-11 NOTE — Progress Notes (Signed)
Subjective:  Patient ID: Justin Wong, male    DOB: [birth year]  Age: 62 y.o. MRN: [id number]  CC: Annual Exam (PHYSICAL)   HPI STEFFAN KINZIE presents for a well exam On Augmentin, steroids for sinusitis since Sat  Outpatient Medications Prior to Visit  Medication Sig Dispense Refill   aspirin 81 MG chewable tablet Chew by mouth daily.     Cetirizine HCl (ZYRTEC ALLERGY PO) Take by mouth daily.     Cholecalciferol (VITAMIN D3) 50 MCG (2000 UT) capsule Take 1 capsule (2,000 Units total) by mouth daily. 100 capsule 3   GLUCOSAMINE HCL-MSM PO Take by mouth.     Magnesium 400 MG TABS Take by mouth.     Multiple Vitamins-Minerals (MENS 50+ MULTI VITAMIN/MIN) TABS Take 1 tablet by mouth daily.     Zinc 50 MG CAPS Take by mouth.     albuterol (VENTOLIN HFA) 108 (90 Base) MCG/ACT inhaler INHALE 2 PUFFS INTO THE LUNGS EVERY 6 (SIX) HOURS AS NEEDED FOR WHEEZING. 18 g 5   Azelastine HCl 137 MCG/SPRAY SOLN Place into both nostrils.     fluticasone-salmeterol (ADVAIR DISKUS) 100-50 MCG/ACT AEPB TAKE 1 PUFF BY MOUTH TWICE A DAY 3 each 3   No facility-administered medications prior to visit.    ROS: Review of Systems  Constitutional:  Negative for appetite change, fatigue and unexpected weight change.  HENT:  Positive for congestion, postnasal drip, rhinorrhea and sinus pressure. Negative for nosebleeds, sneezing, sore throat and trouble swallowing.   Eyes:  Negative for itching and visual disturbance.  Respiratory:  Negative for cough.   Cardiovascular:  Negative for chest pain, palpitations and leg swelling.  Gastrointestinal:  Negative for abdominal distention, blood in stool, diarrhea and nausea.  Genitourinary:  Negative for frequency and hematuria.  Musculoskeletal:  Negative for back pain, gait problem, joint swelling and neck pain.  Skin:  Negative for rash.  Neurological:  Negative for dizziness, tremors, speech difficulty and weakness.  Psychiatric/Behavioral:  Negative for  agitation, dysphoric mood and sleep disturbance. The patient is not nervous/anxious.     Objective:  BP 118/70 (BP Location: Right Arm, Patient Position: Sitting, Cuff Size: Large)   Pulse 91   Temp (!) 100.7 F (38.2 C) (Oral)   Ht 5\' 5"  (1.651 m)   Wt 193 lb (87.5 kg)   SpO2 96%   BMI 32.12 kg/m   BP Readings from Last 3 Encounters:  05/11/23 118/70  05/05/22 120/78  10/14/21 121/70    Wt Readings from Last 3 Encounters:  05/11/23 193 lb (87.5 kg)  05/05/22 196 lb (88.9 kg)  10/14/21 190 lb (86.2 kg)    Physical Exam Constitutional:      General: He is not in acute distress.    Appearance: Normal appearance. He is well-developed.     Comments: NAD  HENT:     Nose: Congestion present.  Eyes:     Conjunctiva/sclera: Conjunctivae normal.     Pupils: Pupils are equal, round, and reactive to light.  Neck:     Thyroid: No thyromegaly.     Vascular: No JVD.  Cardiovascular:     Rate and Rhythm: Normal rate and regular rhythm.     Heart sounds: Normal heart sounds. No murmur heard.    No friction rub. No gallop.  Pulmonary:     Effort: Pulmonary effort is normal. No respiratory distress.     Breath sounds: Normal breath sounds. No wheezing or rales.  Chest:  Chest wall: No tenderness.  Abdominal:     General: Bowel sounds are normal. There is no distension.     Palpations: Abdomen is soft. There is no mass.     Tenderness: There is no abdominal tenderness. There is no guarding or rebound.  Musculoskeletal:        General: No tenderness. Normal range of motion.     Cervical back: Normal range of motion.  Lymphadenopathy:     Cervical: No cervical adenopathy.  Skin:    General: Skin is warm and dry.     Findings: No rash.  Neurological:     Mental Status: He is alert and oriented to person, place, and time.     Cranial Nerves: No cranial nerve deficit.     Motor: No abnormal muscle tone.     Coordination: Coordination normal.     Gait: Gait normal.      Deep Tendon Reflexes: Reflexes are normal and symmetric.  Psychiatric:        Behavior: Behavior normal.        Thought Content: Thought content normal.        Judgment: Judgment normal.    I spent 22 minutes in addition to time for CPX wellness examination in preparing to see the patient by review of recent labs, imaging and procedures, obtaining and reviewing separately obtained history, communicating with the patient, ordering medications, tests or procedures, and documenting clinical information in the EHR including the differential diagnosis, treatment, and any further evaluation and other management of elevated liver function tests, sinusitis, colon polyps.         Lab Results  Component Value Date   WBC 9.8 05/11/2023   HGB 13.6 05/11/2023   HCT 41.0 05/11/2023   PLT 286.0 05/11/2023   GLUCOSE 102 (H) 05/11/2023   CHOL 163 05/11/2023   TRIG 137.0 05/11/2023   HDL 40.90 05/11/2023   LDLDIRECT 150.1 02/17/2012   LDLCALC 94 05/11/2023   ALT 65 (H) 05/11/2023   AST 64 (H) 05/11/2023   NA 135 05/11/2023   K 4.7 05/11/2023   CL 97 05/11/2023   CREATININE 1.03 05/11/2023   BUN 18 05/11/2023   CO2 27 05/11/2023   TSH 1.24 05/11/2023   PSA 2.29 05/11/2023   HGBA1C 5.8 03/06/2015    MR KNEE RIGHT WO CONTRAST  Result Date: 11/08/2018 CLINICAL DATA:  7 weeks ago pt was squatting and stood up when he felt a pop in his right knee. Pain and swelling in right knee since. Pain w/ flexion and extension. EXAM: MRI OF THE RIGHT KNEE WITHOUT CONTRAST TECHNIQUE: Multiplanar, multisequence MR imaging of the knee was performed. No intravenous contrast was administered. COMPARISON:  None. FINDINGS: MENISCI Medial meniscus:  Intact. Lateral meniscus: Large bucket-handle tear of the lateral meniscus flipped towards the intercondylar notch and anteriorly. LIGAMENTS Cruciates:  Intact ACL and PCL. Collaterals: Medial collateral ligament is intact. Lateral collateral ligament complex is intact.  CARTILAGE Patellofemoral:  No chondral defect. Medial:  No focal chondral defect. Lateral:  No focal chondral defect. Joint: Large joint effusion. Normal Hoffa's fat. No plical thickening. Popliteal Fossa:  No Baker cyst. Intact popliteus tendon. Extensor Mechanism: Intact quadriceps tendon. Intact patellar tendon. Intact medial patellar retinaculum. Intact lateral patellar retinaculum. Intact MPFL. Bones:  No acute osseous abnormality.  No aggressive osseous lesion. Other: Muscles are normal.  No fluid collection or hematoma. IMPRESSION: 1. Large bucket-handle tear of the lateral meniscus flipped towards the intercondylar notch and anteriorly. 2. Large joint  effusion. Electronically Signed   By: Elige Ko   On: 11/08/2018 08:11    Assessment & Plan:   Problem List Items Addressed This Visit     Well adult exam - Primary    We discussed age appropriate health related issues, including available/recomended screening tests and vaccinations. Labs were ordered to be later reviewed . All questions were answered. We discussed one or more of the following - seat belt use, use of sunscreen/sun exposure exercise, fall risk reduction, second hand smoke exposure, firearm use and storage, seat belt use, a need for adhering to healthy diet and exercise. Labs were ordered.  All questions were answered. Colon 2022, due in 2027 - Dr Russella Dar Cardiac CT ca score test offered/ordered before - not done yet       Relevant Orders   TSH (Completed)   Urinalysis (Completed)   CBC with Differential/Platelet (Completed)   Lipid panel (Completed)   PSA (Completed)   Comprehensive metabolic panel (Completed)   Colon polyps    Colon 2022, due in 2027 - Dr Russella Dar      Acute sinusitis    On Augmentin, steroids for sinusitis since Sat      Relevant Medications   Azelastine HCl 137 MCG/SPRAY SOLN   Elevated LFTs    Unclear etiology.  Cut back on DayQuil, NyQuil, Tylenol Cut back on alcohol if drinking Obtain liver  ultrasound      Relevant Orders   US Abdomen Limited RUQ (LIVER/GB)      Meds ordered this encounter  Medications   albuterol (VENTOLIN HFA) 108 (90 Base) MCG/ACT inhaler    Sig: INHALE 2 PUFFS INTO THE LUNGS EVERY 6 (SIX) HOURS AS NEEDED FOR WHEEZING.    Dispense:  18 g    Refill:  5   fluticasone-salmeterol (ADVAIR DISKUS) 100-50 MCG/ACT AEPB    Sig: TAKE 1 PUFF BY MOUTH TWICE A DAY    Dispense:  3 each    Refill:  3   Azelastine HCl 137 MCG/SPRAY SOLN    Sig: Place 2 sprays into both nostrils daily. Place into both nostrils.    Dispense:  30 mL    Refill:  3      Follow-up: Return in about 1 year (around 05/10/2024) for Wellness Exam.  Sonda Primes, MD

## 2023-05-11 NOTE — Assessment & Plan Note (Signed)
Colon 2022, due in 2027 - Dr Russella Dar

## 2023-05-11 NOTE — Assessment & Plan Note (Signed)
On Augmentin, steroids for sinusitis since Sat

## 2023-05-11 NOTE — Assessment & Plan Note (Addendum)
We discussed age appropriate health related issues, including available/recomended screening tests and vaccinations. Labs were ordered to be later reviewed . All questions were answered. We discussed one or more of the following - seat belt use, use of sunscreen/sun exposure exercise, fall risk reduction, second hand smoke exposure, firearm use and storage, seat belt use, a need for adhering to healthy diet and exercise. Labs were ordered.  All questions were answered. Colon 2022, due in 2027 - Dr Russella Dar Cardiac CT ca score test offered/ordered before - not done yet

## 2023-05-12 DIAGNOSIS — R7989 Other specified abnormal findings of blood chemistry: Secondary | ICD-10-CM | POA: Insufficient documentation

## 2023-05-12 NOTE — Assessment & Plan Note (Signed)
Unclear etiology.  Cut back on DayQuil, NyQuil, Tylenol Cut back on alcohol if drinking Obtain liver ultrasound

## 2023-05-12 NOTE — Addendum Note (Signed)
Addended by: Tresa Garter on: 05/12/2023 07:12 AM   Modules accepted: Orders

## 2023-05-12 NOTE — Addendum Note (Signed)
Addended by: Tresa Garter on: 05/12/2023 07:15 AM   Modules accepted: Level of Service

## 2024-05-12 ENCOUNTER — Encounter: Payer: Self-pay | Admitting: Internal Medicine

## 2024-05-12 ENCOUNTER — Ambulatory Visit: Payer: Self-pay | Admitting: Internal Medicine

## 2024-05-12 VITALS — BP 134/84 | HR 64 | Temp 97.9°F | Ht 65.0 in | Wt 195.0 lb

## 2024-05-12 DIAGNOSIS — Z0000 Encounter for general adult medical examination without abnormal findings: Secondary | ICD-10-CM

## 2024-05-12 DIAGNOSIS — Z0001 Encounter for general adult medical examination with abnormal findings: Secondary | ICD-10-CM

## 2024-05-12 DIAGNOSIS — Z125 Encounter for screening for malignant neoplasm of prostate: Secondary | ICD-10-CM | POA: Diagnosis not present

## 2024-05-12 DIAGNOSIS — F5101 Primary insomnia: Secondary | ICD-10-CM | POA: Diagnosis not present

## 2024-05-12 DIAGNOSIS — G4700 Insomnia, unspecified: Secondary | ICD-10-CM | POA: Insufficient documentation

## 2024-05-12 LAB — COMPREHENSIVE METABOLIC PANEL WITH GFR
ALT: 21 U/L (ref 0–53)
AST: 19 U/L (ref 0–37)
Albumin: 4.7 g/dL (ref 3.5–5.2)
Alkaline Phosphatase: 73 U/L (ref 39–117)
BUN: 17 mg/dL (ref 6–23)
CO2: 25 meq/L (ref 19–32)
Calcium: 9.9 mg/dL (ref 8.4–10.5)
Chloride: 103 meq/L (ref 96–112)
Creatinine, Ser: 0.95 mg/dL (ref 0.40–1.50)
GFR: 85.66 mL/min (ref >60.00)
Glucose, Bld: 95 mg/dL (ref 70–99)
Potassium: 4.7 meq/L (ref 3.5–5.1)
Sodium: 137 meq/L (ref 135–145)
Total Bilirubin: 0.7 mg/dL (ref 0.2–1.2)
Total Protein: 7.2 g/dL (ref 6.0–8.3)

## 2024-05-12 LAB — LIPID PANEL
Cholesterol: 225 mg/dL — ABNORMAL HIGH (ref 0–200)
HDL: 41.9 mg/dL (ref >39.00)
LDL Cholesterol: 162 mg/dL — ABNORMAL HIGH (ref 0–99)
NonHDL: 183.34
Total CHOL/HDL Ratio: 5
Triglycerides: 105 mg/dL (ref 0.0–149.0)
VLDL: 21 mg/dL (ref 0.0–40.0)

## 2024-05-12 LAB — CBC WITH DIFFERENTIAL/PLATELET
Basophils Absolute: 0.1 10*3/uL (ref 0.0–0.1)
Basophils Relative: 1 % (ref 0.0–3.0)
Eosinophils Absolute: 0.3 10*3/uL (ref 0.0–0.7)
Eosinophils Relative: 6 % — ABNORMAL HIGH (ref 0.0–5.0)
HCT: 42.8 % (ref 39.0–52.0)
Hemoglobin: 14.3 g/dL (ref 13.0–17.0)
Lymphocytes Relative: 25.3 % (ref 12.0–46.0)
Lymphs Abs: 1.4 10*3/uL (ref 0.7–4.0)
MCHC: 33.5 g/dL (ref 30.0–36.0)
MCV: 92.3 fl (ref 78.0–100.0)
Monocytes Absolute: 0.5 10*3/uL (ref 0.1–1.0)
Monocytes Relative: 9.5 % (ref 3.0–12.0)
Neutro Abs: 3.1 10*3/uL (ref 1.4–7.7)
Neutrophils Relative %: 58.2 % (ref 43.0–77.0)
Platelets: 264 10*3/uL (ref 150.0–400.0)
RBC: 4.64 Mil/uL (ref 4.22–5.81)
RDW: 13.7 % (ref 11.5–15.5)
WBC: 5.3 10*3/uL (ref 4.0–10.5)

## 2024-05-12 LAB — URINALYSIS
Bilirubin Urine: NEGATIVE
Hgb urine dipstick: NEGATIVE
Ketones, ur: NEGATIVE
Leukocytes,Ua: NEGATIVE
Nitrite: NEGATIVE
Specific Gravity, Urine: 1.015 (ref 1.000–1.030)
Total Protein, Urine: NEGATIVE
Urine Glucose: NEGATIVE
Urobilinogen, UA: 0.2 (ref 0.0–1.0)
pH: 6 (ref 5.0–8.0)

## 2024-05-12 MED ORDER — ZOLPIDEM TARTRATE 10 MG PO TABS: 5.0000 mg | ORAL_TABLET | Freq: Every evening | ORAL | 3 refills | Status: AC | PRN

## 2024-05-12 NOTE — Addendum Note (Signed)
 Addended by: Yesena Reaves V on: 05/12/2024 09:48 AM   Modules accepted: Orders

## 2024-05-12 NOTE — Assessment & Plan Note (Signed)
We discussed age appropriate health related issues, including available/recomended screening tests and vaccinations. Labs were ordered to be later reviewed . All questions were answered. We discussed one or more of the following - seat belt use, use of sunscreen/sun exposure exercise, fall risk reduction, second hand smoke exposure, firearm use and storage, seat belt use, a need for adhering to healthy diet and exercise. Labs were ordered.  All questions were answered. Colon 2022, due in 2027 - Dr Russella Dar Cardiac CT ca score test offered/ordered before - not done yet

## 2024-05-12 NOTE — Assessment & Plan Note (Addendum)
 New Will try Zolpidem prn  Potential benefits of a long term benzodiazepines  use as well as potential risks  and complications were explained to the patient and were aknowledged.

## 2024-05-12 NOTE — Progress Notes (Signed)
 Subjective:  Patient ID: Justin Wong, male    DOB: 04/17/1961  Age: 63 y.o. MRN: [id number]  CC: Annual Exam   HPI Justin Wong presents for a well exam  Outpatient Medications Prior to Visit  Medication Sig Dispense Refill   albuterol  (VENTOLIN  HFA) 108 (90 Base) MCG/ACT inhaler INHALE 2 PUFFS INTO THE LUNGS EVERY 6 (SIX) HOURS AS NEEDED FOR WHEEZING. 18 g 5   aspirin 81 MG chewable tablet Chew by mouth daily.     Azelastine  HCl 137 MCG/SPRAY SOLN Place 2 sprays into both nostrils daily. Place into both nostrils. 30 mL 3   Cetirizine HCl (ZYRTEC ALLERGY PO) Take by mouth daily.     Cholecalciferol (VITAMIN D3) 50 MCG (2000 UT) capsule Take 1 capsule (2,000 Units total) by mouth daily. 100 capsule 3   fluticasone -salmeterol (ADVAIR DISKUS) 100-50 MCG/ACT AEPB TAKE 1 PUFF BY MOUTH TWICE A DAY 3 each 3   GLUCOSAMINE HCL-MSM PO Take by mouth.     Magnesium 400 MG TABS Take by mouth.     Multiple Vitamins-Minerals (MENS 50+ MULTI VITAMIN/MIN) TABS Take 1 tablet by mouth daily.     Zinc 50 MG CAPS Take by mouth.     No facility-administered medications prior to visit.    ROS: Review of Systems  Constitutional:  Negative for appetite change, fatigue and unexpected weight change.  HENT:  Negative for congestion, nosebleeds, sneezing, sore throat and trouble swallowing.   Eyes:  Negative for itching and visual disturbance.  Respiratory:  Negative for cough.   Cardiovascular:  Negative for chest pain, palpitations and leg swelling.  Gastrointestinal:  Negative for abdominal distention, blood in stool, diarrhea and nausea.  Genitourinary:  Negative for frequency and hematuria.  Musculoskeletal:  Negative for back pain, gait problem, joint swelling and neck pain.  Skin:  Negative for rash.  Neurological:  Negative for dizziness, tremors, speech difficulty and weakness.  Psychiatric/Behavioral:  Positive for sleep disturbance. Negative for agitation, dysphoric mood and suicidal  ideas. The patient is not nervous/anxious.     Objective:  BP 134/84 (BP Location: Left Arm, Patient Position: Sitting)   Pulse 64   Temp 97.9 F (36.6 C) (Temporal)   Ht 5' 5 (1.651 m)   Wt 195 lb (88.5 kg)   SpO2 97%   BMI 32.45 kg/m   BP Readings from Last 3 Encounters:  05/12/24 134/84  05/11/23 118/70  05/05/22 120/78    Wt Readings from Last 3 Encounters:  05/12/24 195 lb (88.5 kg)  05/11/23 193 lb (87.5 kg)  05/05/22 196 lb (88.9 kg)    Physical Exam Constitutional:      General: He is not in acute distress.    Appearance: Normal appearance. He is well-developed.     Comments: NAD   Eyes:     Conjunctiva/sclera: Conjunctivae normal.     Pupils: Pupils are equal, round, and reactive to light.   Neck:     Thyroid : No thyromegaly.     Vascular: No JVD.   Cardiovascular:     Rate and Rhythm: Normal rate and regular rhythm.     Heart sounds: Normal heart sounds. No murmur heard.    No friction rub. No gallop.  Pulmonary:     Effort: Pulmonary effort is normal. No respiratory distress.     Breath sounds: Normal breath sounds. No wheezing or rales.  Chest:     Chest wall: No tenderness.  Abdominal:     General: Bowel sounds are  normal. There is no distension.     Palpations: Abdomen is soft. There is no mass.     Tenderness: There is no abdominal tenderness. There is no guarding or rebound.   Musculoskeletal:        General: No tenderness. Normal range of motion.     Cervical back: Normal range of motion.  Lymphadenopathy:     Cervical: No cervical adenopathy.   Skin:    General: Skin is warm and dry.     Findings: No rash.   Neurological:     Mental Status: He is alert and oriented to person, place, and time.     Cranial Nerves: No cranial nerve deficit.     Motor: No abnormal muscle tone.     Coordination: Coordination normal.     Gait: Gait normal.     Deep Tendon Reflexes: Reflexes are normal and symmetric.   Psychiatric:        Behavior:  Behavior normal.        Thought Content: Thought content normal.        Judgment: Judgment normal.   Pt declined rectal exam  Lab Results  Component Value Date   WBC 9.8 05/11/2023   HGB 13.6 05/11/2023   HCT 41.0 05/11/2023   PLT 286.0 05/11/2023   GLUCOSE 102 (H) 05/11/2023   CHOL 163 05/11/2023   TRIG 137.0 05/11/2023   HDL 40.90 05/11/2023   LDLDIRECT 150.1 02/17/2012   LDLCALC 94 05/11/2023   ALT 65 (H) 05/11/2023   AST 64 (H) 05/11/2023   NA 135 05/11/2023   K 4.7 05/11/2023   CL 97 05/11/2023   CREATININE 1.03 05/11/2023   BUN 18 05/11/2023   CO2 27 05/11/2023   TSH 1.24 05/11/2023   PSA 2.29 05/11/2023   HGBA1C 5.8 03/06/2015    MR KNEE RIGHT WO CONTRAST Result Date: 11/08/2018 CLINICAL DATA:  7 weeks ago pt was squatting and stood up when he felt a pop in his right knee. Pain and swelling in right knee since. Pain w/ flexion and extension. EXAM: MRI OF THE RIGHT KNEE WITHOUT CONTRAST TECHNIQUE: Multiplanar, multisequence MR imaging of the knee was performed. No intravenous contrast was administered. COMPARISON:  None. FINDINGS: MENISCI Medial meniscus:  Intact. Lateral meniscus: Large bucket-handle tear of the lateral meniscus flipped towards the intercondylar notch and anteriorly. LIGAMENTS Cruciates:  Intact ACL and PCL. Collaterals: Medial collateral ligament is intact. Lateral collateral ligament complex is intact. CARTILAGE Patellofemoral:  No chondral defect. Medial:  No focal chondral defect. Lateral:  No focal chondral defect. Joint: Large joint effusion. Normal Hoffa's fat. No plical thickening. Popliteal Fossa:  No Baker cyst. Intact popliteus tendon. Extensor Mechanism: Intact quadriceps tendon. Intact patellar tendon. Intact medial patellar retinaculum. Intact lateral patellar retinaculum. Intact MPFL. Bones:  No acute osseous abnormality.  No aggressive osseous lesion. Other: Muscles are normal.  No fluid collection or hematoma. IMPRESSION: 1. Large bucket-handle  tear of the lateral meniscus flipped towards the intercondylar notch and anteriorly. 2. Large joint effusion. Electronically Signed   By: Onnie Bilis   On: 11/08/2018 08:11    Assessment & Plan:   Problem List Items Addressed This Visit     Well adult exam - Primary   We discussed age appropriate health related issues, including available/recomended screening tests and vaccinations. Labs were ordered to be later reviewed . All questions were answered. We discussed one or more of the following - seat belt use, use of sunscreen/sun exposure exercise, fall risk  reduction, second hand smoke exposure, firearm use and storage, seat belt use, a need for adhering to healthy diet and exercise. Labs were ordered.  All questions were answered. Colon 2022, due in 2027 - Dr Sandrea Cruel Cardiac CT ca score test offered/ordered before - not done yet       Insomnia   New Will try Zolpidem prn  Potential benefits of a long term benzodiazepines  use as well as potential risks  and complications were explained to the patient and were aknowledged.           Meds ordered this encounter  Medications   zolpidem (AMBIEN) 10 MG tablet    Sig: Take 0.5-1 tablets (5-10 mg total) by mouth at bedtime as needed for sleep.    Dispense:  30 tablet    Refill:  3      Follow-up: No follow-ups on file.  Anitra Barn, MD

## 2024-05-13 LAB — TSH: TSH: 1.85 u[IU]/mL (ref 0.35–5.50)

## 2024-05-13 LAB — PSA: PSA: 2.55 ng/mL (ref 0.10–4.00)

## 2024-05-16 ENCOUNTER — Ambulatory Visit: Payer: Self-pay | Admitting: Internal Medicine

## 2024-06-07 ENCOUNTER — Other Ambulatory Visit: Payer: Self-pay | Admitting: Internal Medicine

## 2024-11-05 ENCOUNTER — Other Ambulatory Visit: Payer: Self-pay | Admitting: Internal Medicine

## 2025-05-15 ENCOUNTER — Encounter: Admitting: Internal Medicine
